# Patient Record
Sex: Male | Born: 1970 | Race: Black or African American | Hispanic: No | Marital: Married | Smoking: Never smoker
Health system: Southern US, Community
[De-identification: ages and names within clinical notes are randomized; demographics above are authoritative.]

---

## 2020-01-18 ENCOUNTER — Inpatient Hospital Stay (HOSPITAL_COMMUNITY)
Admission: EM | Admit: 2020-01-18 | Discharge: 2020-01-22 | DRG: 177 | Disposition: A | Discharge status: Home / Self Care | Payer: HRSA Program | Source: Ambulatory Visit | Attending: Family Medicine | Admitting: Family Medicine

## 2020-01-18 ENCOUNTER — Emergency Department (HOSPITAL_COMMUNITY): Payer: HRSA Program

## 2020-01-18 ENCOUNTER — Encounter (HOSPITAL_COMMUNITY): Payer: Self-pay | Admitting: Family Medicine

## 2020-01-18 DIAGNOSIS — E1165 Type 2 diabetes mellitus with hyperglycemia: Secondary | ICD-10-CM | POA: Diagnosis present

## 2020-01-18 DIAGNOSIS — R410 Disorientation, unspecified: Secondary | ICD-10-CM

## 2020-01-18 DIAGNOSIS — N179 Acute kidney failure, unspecified: Secondary | ICD-10-CM | POA: Diagnosis present

## 2020-01-18 DIAGNOSIS — U071 COVID-19: Secondary | ICD-10-CM | POA: Diagnosis present

## 2020-01-18 DIAGNOSIS — G4730 Sleep apnea, unspecified: Secondary | ICD-10-CM | POA: Diagnosis present

## 2020-01-18 DIAGNOSIS — J9601 Acute respiratory failure with hypoxia: Secondary | ICD-10-CM | POA: Diagnosis present

## 2020-01-18 DIAGNOSIS — J1282 Pneumonia due to coronavirus disease 2019: Secondary | ICD-10-CM

## 2020-01-18 LAB — COMPREHENSIVE METABOLIC PANEL
ALT: 25 U/L (ref 0–44)
AST: 44 U/L — ABNORMAL HIGH (ref 15–41)
Albumin: 3.2 g/dL — ABNORMAL LOW (ref 3.5–5.0)
Alkaline Phosphatase: 45 U/L (ref 38–126)
Anion gap: 13 (ref 5–15)
BUN: 31 mg/dL — ABNORMAL HIGH (ref 6–20)
CO2: 33 mmol/L — ABNORMAL HIGH (ref 22–32)
Calcium: 8.2 mg/dL — ABNORMAL LOW (ref 8.9–10.3)
Chloride: 91 mmol/L — ABNORMAL LOW (ref 98–111)
Creatinine, Ser: 1.58 mg/dL — ABNORMAL HIGH (ref 0.61–1.24)
GFR calc Af Amer: 59 mL/min — ABNORMAL LOW (ref >60)
GFR calc non Af Amer: 51 mL/min — ABNORMAL LOW (ref >60)
Glucose, Bld: 135 mg/dL — ABNORMAL HIGH (ref 70–99)
Potassium: 3.9 mmol/L (ref 3.5–5.1)
Sodium: 137 mmol/L (ref 135–145)
Total Bilirubin: 0.6 mg/dL (ref 0.3–1.2)
Total Protein: 7.5 g/dL (ref 6.5–8.1)

## 2020-01-18 LAB — CBC WITH DIFFERENTIAL/PLATELET
Abs Immature Granulocytes: 0.06 10*3/uL (ref 0.00–0.07)
Basophils Absolute: 0 10*3/uL (ref 0.0–0.1)
Basophils Relative: 0 %
Eosinophils Absolute: 0 10*3/uL (ref 0.0–0.5)
Eosinophils Relative: 0 %
HCT: 42 % (ref 39.0–52.0)
Hemoglobin: 13.4 g/dL (ref 13.0–17.0)
Immature Granulocytes: 1 %
Lymphocytes Relative: 18 %
Lymphs Abs: 1 10*3/uL (ref 0.7–4.0)
MCH: 27.1 pg (ref 26.0–34.0)
MCHC: 31.9 g/dL (ref 30.0–36.0)
MCV: 84.8 fL (ref 80.0–100.0)
Monocytes Absolute: 0.4 10*3/uL (ref 0.1–1.0)
Monocytes Relative: 7 %
Neutro Abs: 4.2 10*3/uL (ref 1.7–7.7)
Neutrophils Relative %: 74 %
Platelets: 229 10*3/uL (ref 150–400)
RBC: 4.95 MIL/uL (ref 4.22–5.81)
RDW: 14.5 % (ref 11.5–15.5)
WBC: 5.6 10*3/uL (ref 4.0–10.5)
nRBC: 0.4 % — ABNORMAL HIGH (ref 0.0–0.2)

## 2020-01-18 LAB — MRSA PCR SCREENING: MRSA by PCR: NEGATIVE

## 2020-01-18 LAB — D-DIMER, QUANTITATIVE: D-Dimer, Quant: 1.4 ug/mL-FEU — ABNORMAL HIGH (ref 0.00–0.50)

## 2020-01-18 LAB — PROCALCITONIN: Procalcitonin: 0.17 ng/mL

## 2020-01-18 LAB — FERRITIN: Ferritin: 242 ng/mL (ref 24–336)

## 2020-01-18 LAB — LACTATE DEHYDROGENASE: LDH: 488 U/L — ABNORMAL HIGH (ref 98–192)

## 2020-01-18 LAB — BRAIN NATRIURETIC PEPTIDE: B Natriuretic Peptide: 93.4 pg/mL (ref 0.0–100.0)

## 2020-01-18 LAB — ABO/RH: ABO/RH(D): B POS

## 2020-01-18 LAB — TRIGLYCERIDES: Triglycerides: 120 mg/dL (ref <150)

## 2020-01-18 LAB — POC SARS CORONAVIRUS 2 AG -  ED: SARS Coronavirus 2 Ag: POSITIVE — AB

## 2020-01-18 LAB — LACTIC ACID, PLASMA
Lactic Acid, Venous: 1.4 mmol/L (ref 0.5–1.9)
Lactic Acid, Venous: 1.3 mmol/L (ref 0.5–1.9)

## 2020-01-18 LAB — C-REACTIVE PROTEIN: CRP: 20.8 mg/dL — ABNORMAL HIGH (ref <1.0)

## 2020-01-18 LAB — FIBRINOGEN: Fibrinogen: 671 mg/dL — ABNORMAL HIGH (ref 210–475)

## 2020-01-18 MED ORDER — CHLORHEXIDINE GLUCONATE CLOTH 2 % EX PADS
6.0000 | MEDICATED_PAD | Freq: Every day | CUTANEOUS | Status: DC
  Administered 2020-01-18 – 2020-01-20 (×2): 6 via TOPICAL

## 2020-01-18 MED ORDER — DEXAMETHASONE SODIUM PHOSPHATE 10 MG/ML IJ SOLN
6.0000 mg | Freq: Once | INTRAMUSCULAR | Status: AC
  Administered 2020-01-18: 16:00:00 6 mg via INTRAVENOUS
  Filled 2020-01-18: qty 1

## 2020-01-18 MED ORDER — ONDANSETRON HCL 4 MG PO TABS: 4.0000 mg | ORAL_TABLET | Freq: Four times a day (QID) | ORAL | Status: DC | PRN

## 2020-01-18 MED ORDER — ENOXAPARIN SODIUM 80 MG/0.8ML ~~LOC~~ SOLN
80.0000 mg | SUBCUTANEOUS | Status: DC
  Administered 2020-01-18 – 2020-01-21 (×4): 80 mg via SUBCUTANEOUS
  Filled 2020-01-18 (×4): qty 0.8

## 2020-01-18 MED ORDER — DEXAMETHASONE SODIUM PHOSPHATE 10 MG/ML IJ SOLN
6.0000 mg | Freq: Every day | INTRAMUSCULAR | Status: DC
  Administered 2020-01-19 – 2020-01-22 (×4): 6 mg via INTRAVENOUS
  Filled 2020-01-18 (×4): qty 1

## 2020-01-18 MED ORDER — TOCILIZUMAB 400 MG/20ML IV SOLN
800.0000 mg | Freq: Once | INTRAVENOUS | Status: AC
  Administered 2020-01-18: 21:00:00 800 mg via INTRAVENOUS
  Filled 2020-01-18: qty 40

## 2020-01-18 MED ORDER — SODIUM CHLORIDE 0.9 % IV SOLN: 100.0000 mg | Freq: Every day | INTRAVENOUS | Status: DC

## 2020-01-18 MED ORDER — ENOXAPARIN SODIUM 80 MG/0.8ML ~~LOC~~ SOLN
80.0000 mg | Freq: Two times a day (BID) | SUBCUTANEOUS | Status: DC
  Filled 2020-01-18: qty 0.8

## 2020-01-18 MED ORDER — SODIUM CHLORIDE 0.9 % IV SOLN
100.0000 mg | INTRAVENOUS | Status: AC
  Administered 2020-01-18 (×2): 100 mg via INTRAVENOUS
  Filled 2020-01-18 (×2): qty 20

## 2020-01-18 MED ORDER — SODIUM CHLORIDE 0.9 % IV SOLN
100.0000 mg | Freq: Every day | INTRAVENOUS | Status: DC
  Administered 2020-01-19 – 2020-01-22 (×4): 100 mg via INTRAVENOUS
  Filled 2020-01-18 (×4): qty 20

## 2020-01-18 MED ORDER — ACETAMINOPHEN 325 MG PO TABS
650.0000 mg | ORAL_TABLET | Freq: Four times a day (QID) | ORAL | Status: DC | PRN
  Administered 2020-01-20: 650 mg via ORAL
  Filled 2020-01-18: qty 2

## 2020-01-18 MED ORDER — ONDANSETRON HCL 4 MG/2ML IJ SOLN: 4.0000 mg | Freq: Four times a day (QID) | INTRAMUSCULAR | Status: DC | PRN

## 2020-01-18 MED ORDER — SODIUM CHLORIDE 0.9 % IV SOLN: 200.0000 mg | Freq: Once | INTRAVENOUS | Status: DC

## 2020-01-18 MED ORDER — ORAL CARE MOUTH RINSE
15.0000 mL | Freq: Two times a day (BID) | OROMUCOSAL | Status: DC
  Administered 2020-01-18 – 2020-01-22 (×8): 15 mL via OROMUCOSAL

## 2020-01-18 NOTE — Progress Notes (Signed)
Pt arrived to floor from ED. Called central TELE to place on monitor. CHG bath given, MRSA swab sent. Alert x4, placed pt on 15L HFNC, diminished in all fields, dyspnea on exertion. Patient walked from stretcher to bed, fatigue/general weakness noted. 2 IVs NSL, WNL.

## 2020-01-18 NOTE — ED Provider Notes (Signed)
[facility]-EMERGENCY DEPT Provider Note   CSN: [id number] Arrival date & time: 01/18/20  1358     History Chief Complaint  Patient presents with  . Shortness of Breath    Andrew [name] is a 49 y.o. male.  He said he tested positive for Covid on March 13 while at [facility].  He has been short of breath for a week.  He said his wife said his breathing pattern was strange when he sleeps.  He went to his primary care doctor who found his sats to be 67% on room air.  EMS placed on nonrebreather.  Patient states he would be unable to climb a flight of stairs.  Cough productive of some yellow sputum.  Headache, lack of taste and smell.  Low-grade fevers.  No abdominal pain vomiting or diarrhea.  The history is provided by the patient.  Influenza Presenting symptoms: cough, fatigue, fever, headache, myalgias and shortness of breath   Presenting symptoms: no sore throat   Severity:  Severe Onset quality:  Gradual Progression:  Unchanged Chronicity:  New Relieved by:  Nothing Worsened by:  Movement Ineffective treatments:  None tried Associated symptoms: no chills, no neck stiffness and no syncope        No past medical history on file.  There are no problems to display for this patient.   History reviewed. No pertinent surgical history.     No family history on file.  Social History   Tobacco Use  . Smoking status: Not on file  Substance Use Topics  . Alcohol use: Not on file  . Drug use: Not on file    Home Medications Prior to Admission medications   Not on File    Allergies    Patient has no known allergies.  Review of Systems   Review of Systems  Constitutional: Positive for fatigue and fever. Negative for chills.  HENT: Negative for sore throat.   Eyes: Negative for visual disturbance.  Respiratory: Positive for cough and shortness of breath.   Cardiovascular: Negative for chest pain.  Gastrointestinal: Negative for abdominal  pain.  Genitourinary: Negative for dysuria.  Musculoskeletal: Positive for myalgias. Negative for neck stiffness.  Skin: Negative for rash.  Neurological: Positive for headaches.    Physical Exam Updated Vital Signs BP (!) 141/95 (BP Location: Right Arm)   Pulse 97   Temp 99.5 F (37.5 C) (Oral)   Resp (!) 24   Ht 5\' 10"  (1.778 m)   Wt (!) 158.8 kg   SpO2 (!) 70% Comment: pt placed on non rebreather by ems. pt sats are now 97%  BMI 50.22 kg/m   Physical Exam Vitals and nursing note reviewed.  Constitutional:      Appearance: He is well-developed. He is obese.  HENT:     Head: Normocephalic and atraumatic.  Eyes:     Conjunctiva/sclera: Conjunctivae normal.  Cardiovascular:     Rate and Rhythm: Normal rate and regular rhythm.     Heart sounds: No murmur.  Pulmonary:     Effort: Tachypnea and accessory muscle usage present. No respiratory distress.     Breath sounds: Normal breath sounds.  Abdominal:     Palpations: Abdomen is soft.     Tenderness: There is no abdominal tenderness.  Musculoskeletal:        General: Normal range of motion.     Cervical back: Neck supple.     Right lower leg: No tenderness.     Left lower leg:  No tenderness.  Skin:    General: Skin is warm and dry.     Capillary Refill: Capillary refill takes less than 2 seconds.  Neurological:     General: No focal deficit present.     Mental Status: He is alert.     ED Results / Procedures / Treatments   Labs (all labs ordered are listed, but only abnormal results are displayed) Labs Reviewed  CBC WITH DIFFERENTIAL/PLATELET - Abnormal; Notable for the following components:      Result Value   nRBC 0.4 (*)    All other components within normal limits  COMPREHENSIVE METABOLIC PANEL - Abnormal; Notable for the following components:   Chloride 91 (*)    CO2 33 (*)    Glucose, Bld 135 (*)    BUN 31 (*)    Creatinine, Ser 1.58 (*)    Calcium 8.2 (*)    Albumin 3.2 (*)    AST 44 (*)    GFR  calc non Af Amer 51 (*)    GFR calc Af Amer 59 (*)    All other components within normal limits  D-DIMER, QUANTITATIVE (NOT AT [facility]) - Abnormal; Notable for the following components:   D-Dimer, Quant 1.40 (*)    All other components within normal limits  LACTATE DEHYDROGENASE - Abnormal; Notable for the following components:   LDH 488 (*)    All other components within normal limits  FIBRINOGEN - Abnormal; Notable for the following components:   Fibrinogen 671 (*)    All other components within normal limits  C-REACTIVE PROTEIN - Abnormal; Notable for the following components:   CRP 20.8 (*)    All other components within normal limits  POC SARS CORONAVIRUS 2 AG -  ED - Abnormal; Notable for the following components:   SARS Coronavirus 2 Ag POSITIVE (*)    All other components within normal limits  CULTURE, BLOOD (ROUTINE X 2)  CULTURE, BLOOD (ROUTINE X 2)  MRSA PCR SCREENING  LACTIC ACID, PLASMA  LACTIC ACID, PLASMA  PROCALCITONIN  FERRITIN  TRIGLYCERIDES  BRAIN NATRIURETIC PEPTIDE  HIV ANTIBODY (ROUTINE TESTING W REFLEX)  CBC WITH DIFFERENTIAL/PLATELET  COMPREHENSIVE METABOLIC PANEL  C-REACTIVE PROTEIN  D-DIMER, QUANTITATIVE (NOT AT [facility])  FERRITIN  MAGNESIUM  PHOSPHORUS  ABO/RH    EKG EKG Interpretation  Date/Time:  Monday January 18 2020 14:04:13 EDT Ventricular Rate:  99 PR Interval:    QRS Duration: 97 QT Interval:  335 QTC Calculation: 430 R Axis:   13 Text Interpretation: Ectopic atrial rhythm Anterior infarct, old No old tracing to compare Confirmed by Meridee Score [phone]) on 01/18/2020 2:20:21 PM   Radiology DG Chest Port 1 View  Result Date: 01/18/2020 CLINICAL DATA:  49 yo male diagnosed Covid + on March 13th. C/O increased SHOB for the past 3 days. Went to PCP for eval today. Sat at that time 67%RA. Placed on 3NC. Sat in the upper 80'S when EMS arrivedd. NRB placed by EMS and Sat improved to.*comment was truncated*sob covid EXAM: PORTABLE CHEST 1  VIEW COMPARISON:  None. FINDINGS: Enlarged cardiac silhouette. Low lung volumes. There is patchy bilateral airspace disease. No pneumothorax. No effusions evident IMPRESSION: Patchy bilateral airspace disease consistent with COVID pneumonia Electronically Signed   By: Genevive Bi M.D.   On: 01/18/2020 15:13    Procedures .Critical Care Performed by: Terrilee Files, MD Authorized by: Terrilee Files, MD   Critical care provider statement:    Critical care time (minutes):  45  Critical care time was exclusive of:  Separately billable procedures and treating other patients   Critical care was necessary to treat or prevent imminent or life-threatening deterioration of the following conditions:  Respiratory failure   Critical care was time spent personally by me on the following activities:  Discussions with consultants, evaluation of patient's response to treatment, examination of patient, ordering and performing treatments and interventions, ordering and review of laboratory studies, ordering and review of radiographic studies, pulse oximetry, re-evaluation of patient's condition, obtaining history from patient or surrogate, review of old charts and development of treatment plan with patient or surrogate   I assumed direction of critical care for this patient from another provider in my specialty: no     (including critical care time)  Medications Ordered in ED Medications  tocilizumab (ACTEMRA) 800 mg in sodium chloride 0.9 % 100 mL infusion (has no administration in time range)  acetaminophen (TYLENOL) tablet 650 mg (has no administration in time range)  ondansetron (ZOFRAN) tablet 4 mg (has no administration in time range)    Or  ondansetron (ZOFRAN) injection 4 mg (has no administration in time range)  dexamethasone (DECADRON) injection 6 mg (has no administration in time range)  remdesivir 100 mg in sodium chloride 0.9 % 100 mL IVPB (has no administration in time range)     Followed by  remdesivir 100 mg in sodium chloride 0.9 % 100 mL IVPB (has no administration in time range)  Chlorhexidine Gluconate Cloth 2 % PADS 6 each (has no administration in time range)  MEDLINE mouth rinse (has no administration in time range)  enoxaparin (LOVENOX) injection 80 mg (has no administration in time range)  dexamethasone (DECADRON) injection 6 mg (6 mg Intravenous Given 01/18/20 1548)    ED Course  I have reviewed the triage vital signs and the nursing notes.  Pertinent labs & imaging results that were available during my care of the patient were reviewed by me and considered in my medical decision making (see chart for details).  Clinical Course as of Jan 17 1913  Mon Jan 18, 2020  1420 Differential includes Covid pneumonia, bacterial superinfection, respiratory failure, metabolic derangement   [MB]  1517 Chest x-ray interpreted by me as underpenetrated due to habitus likely multifocal pneumonia.   [MB]  [id number] Patient still on 15 L nonrebreather.  Discussed with respiratory who was can of bring down to [place] and see if that would get him more comfortable.    [MB]  1643 Discussed with Dr. Caleb Popp from [facility] to evaluate the patient for admission.   [MB]    Clinical Course User Index [MB] Terrilee Files, MD   MDM Rules/Calculators/A&P                     Andrew [name] was evaluated in Emergency Department on 01/18/2020 for the symptoms described in the history of present illness. He was evaluated in the context of the global COVID-19 pandemic, which necessitated consideration that the patient might be at risk for infection with the SARS-CoV-2 virus that causes COVID-19. Institutional protocols and algorithms that pertain to the evaluation of patients at risk for COVID-19 are in a state of rapid change based on information released by regulatory bodies including the CDC and federal and state organizations. These policies and algorithms were followed during the  patient's care in the ED.   Final Clinical Impression(s) / ED Diagnoses Final diagnoses:  Pneumonia due to COVID-19 virus  Acute respiratory failure with  hypoxia [facility])    Rx / DC Orders ED Discharge Orders    None       Hayden Rasmussen, MD 01/18/20 [phone]

## 2020-01-18 NOTE — ED Notes (Signed)
Attempted to call update to wife, Burnis Kingfisher. Answering machine, no message left for HIPPA reasons.

## 2020-01-18 NOTE — ED Triage Notes (Signed)
49 yo male diagnosed Covid + on March 13th. C/O increased SHOB for the past 3 days. Went to PCP for eval today. Sat at that time 67%RA. Placed on 3NC. Sat in the upper 80'S when EMS arrivedd. NRB placed by EMS and Sat improved to 98%. Pt arrives A/Ox3. Denies fevers chills. Skin w/d/warm. Denies chest pain.  Denies PMH

## 2020-01-18 NOTE — H&P (Signed)
History and Physical    Andrew [name] PYK:[id number] DOB: 1971-06-02 DOA: 01/18/2020  PCP: Andrew Sailors, PA Patient coming from: Home  Chief Complaint: Dyspnea and confusion  HPI: Andrew [name] is a 49 y.o. male with no medical history. Patient states that he was at [place] with his wife and EMS was called for transport to the hospital. Per wife, she took him to urgent care today because of change in breathing and confusion. She also notes that he had very dry mucous membranes. At urgent care, EMS was called for transport to the ED.  ED Course: Vitals: Tmax of 99.5 F, pulse of 89 bpm, respirations of 27, BP of 118/71, SpO2 of 95% on HFNC Labs: CO2 of 33, BUN of 31, Creatinine of 1.58, calcium of 8.2, d-dimer of 1.4, fibrinogen of 671, procalcitonin of 0.17, CRP of 20.8 Imaging: Chest x-ray significant for bilateral patchy airspace disease Medications/Course: Dexamethasone IV  Review of Systems: Review of Systems  Constitutional: Positive for chills and fever.  Respiratory: Negative for shortness of breath.   Cardiovascular: Negative for chest pain.  Gastrointestinal: Negative for constipation, diarrhea, nausea and vomiting.  All other systems reviewed and are negative.   History reviewed. No pertinent past medical history.  History reviewed. No pertinent surgical history.   has no history on file for tobacco, alcohol, and drug.  No Known Allergies  Family History  Problem Relation Age of Onset  . Heart disease Father     Prior to Admission medications   Not on File    Physical Exam:  Physical Exam Constitutional:      General: He is not in acute distress.    Appearance: Normal appearance. He is well-developed. He is morbidly obese. He is not ill-appearing, toxic-appearing or diaphoretic.  HENT:     Mouth/Throat:     Mouth: Mucous membranes are dry.  Eyes:     Conjunctiva/sclera: Conjunctivae normal.     Pupils: Pupils are equal, round, and  reactive to light.  Cardiovascular:     Rate and Rhythm: Normal rate and regular rhythm.     Heart sounds: Normal heart sounds. No murmur.  Pulmonary:     Effort: Pulmonary effort is normal. No respiratory distress.     Breath sounds: Decreased air movement (secondary to body habitus) present. No wheezing or rales.  Abdominal:     General: Abdomen is protuberant. Bowel sounds are normal. There is no distension.     Palpations: Abdomen is soft.     Tenderness: There is no abdominal tenderness. There is no guarding or rebound.  Musculoskeletal:        General: No tenderness. Normal range of motion.     Cervical back: Normal range of motion.  Lymphadenopathy:     Cervical: No cervical adenopathy.  Skin:    General: Skin is warm and dry.  Neurological:     Mental Status: He is alert and oriented to person, place, and time.  Psychiatric:        Behavior: Behavior is cooperative.     Labs on Admission: I have personally reviewed following labs and imaging studies  CBC: Recent Labs  Lab 01/18/20 1455  WBC 5.6  NEUTROABS 4.2  HGB 13.4  HCT 42.0  MCV 84.8  PLT 539    Basic Metabolic Panel: Recent Labs  Lab 01/18/20 1455  NA 137  K 3.9  CL 91*  CO2 33*  GLUCOSE 135*  BUN 31*  CREATININE 1.58*  CALCIUM 8.2*  GFR: Estimated Creatinine Clearance: 87.7 mL/min (A) (by C-G formula based on SCr of 1.58 mg/dL (H)).  Liver Function Tests: Recent Labs  Lab 01/18/20 1455  AST 44*  ALT 25  ALKPHOS 45  BILITOT 0.6  PROT 7.5  ALBUMIN 3.2*   No results for input(s): LIPASE, AMYLASE in the last 168 hours. No results for input(s): AMMONIA in the last 168 hours.  Coagulation Profile: No results for input(s): INR, PROTIME in the last 168 hours.  Cardiac Enzymes: No results for input(s): CKTOTAL, CKMB, CKMBINDEX, TROPONINI in the last 168 hours.  BNP (last 3 results) No results for input(s): PROBNP in the last 8760 hours.  HbA1C: No results for input(s): HGBA1C in  the last 72 hours.  CBG: No results for input(s): GLUCAP in the last 168 hours.  Lipid Profile: Recent Labs    01/18/20 1455  TRIG 120    Thyroid Function Tests: No results for input(s): TSH, T4TOTAL, FREET4, T3FREE, THYROIDAB in the last 72 hours.  Anemia Panel: Recent Labs    01/18/20 1455  FERRITIN 242    Urine analysis: No results found for: COLORURINE, APPEARANCEUR, LABSPEC, PHURINE, GLUCOSEU, HGBUR, BILIRUBINUR, KETONESUR, PROTEINUR, UROBILINOGEN, NITRITE, LEUKOCYTESUR   Radiological Exams on Admission: DG Chest Port 1 View  Result Date: 01/18/2020 CLINICAL DATA:  49 yo male diagnosed Covid + on March 13th. C/O increased SHOB for the past 3 days. Went to PCP for eval today. Sat at that time 67%RA. Placed on 3NC. Sat in the upper 80'S when EMS arrivedd. NRB placed by EMS and Sat improved to.*comment was truncated*sob covid EXAM: PORTABLE CHEST 1 VIEW COMPARISON:  None. FINDINGS: Enlarged cardiac silhouette. Low lung volumes. There is patchy bilateral airspace disease. No pneumothorax. No effusions evident IMPRESSION: Patchy bilateral airspace disease consistent with COVID pneumonia Electronically Signed   By: Genevive Bi M.D.   On: 01/18/2020 15:13    EKG: Independently reviewed. Sinus rhythm  Assessment/Plan Active Problems:   Pneumonia due to COVID-19 virus   COVID-19 Pneumonia No known exposure. Patient states he has been using PPE and staying indoors. Positive test from 3/13 per patient. Currently requiring 10 L via HFNC with labs concerning for more significant disease. Patient appears stable at this time. Discussed use of Remdesivir and Actemra. Risks and benefits discussed. -Continue Decadron -Remdesivir per pharmacy -Actemra 800 mg IV x1 -Daily CMP, CBC, Ferritin, D-dimer, CRP, mag, phos  Acute respiratory failure with hypoxia Secondary to above -Continue HFNC and keep O2 >92%.   AKI No baseline known. Clinically, patient appears dry. -Normal  saline IV fluids overnight; judicious use of IV fluids in setting of above -BMP in AM  Confusion Possibly secondary to COVID. Monitor  DVT prophylaxis: Lovenox Code Status: Full code Family Communication: Wife on telephone Disposition Plan: Stepdown Consults called: None Admission status: Inpatient   Andrew Hawking, MD [facility] 01/18/2020, 5:19 PM

## 2020-01-18 NOTE — ED Notes (Signed)
Resp at bedside to change NRB to 10L High flow humidified O2. Pt A/Ox3. Skin w/d/pink. Hospitalist at bedside to eval.

## 2020-01-19 ENCOUNTER — Inpatient Hospital Stay (HOSPITAL_COMMUNITY): Payer: HRSA Program

## 2020-01-19 ENCOUNTER — Other Ambulatory Visit: Payer: Self-pay

## 2020-01-19 DIAGNOSIS — J9601 Acute respiratory failure with hypoxia: Secondary | ICD-10-CM

## 2020-01-19 DIAGNOSIS — N179 Acute kidney failure, unspecified: Secondary | ICD-10-CM

## 2020-01-19 DIAGNOSIS — R410 Disorientation, unspecified: Secondary | ICD-10-CM

## 2020-01-19 LAB — COMPREHENSIVE METABOLIC PANEL
ALT: 25 U/L (ref 0–44)
AST: 38 U/L (ref 15–41)
Albumin: 3.2 g/dL — ABNORMAL LOW (ref 3.5–5.0)
Alkaline Phosphatase: 48 U/L (ref 38–126)
Anion gap: 15 (ref 5–15)
BUN: 37 mg/dL — ABNORMAL HIGH (ref 6–20)
CO2: 33 mmol/L — ABNORMAL HIGH (ref 22–32)
Calcium: 8.3 mg/dL — ABNORMAL LOW (ref 8.9–10.3)
Chloride: 94 mmol/L — ABNORMAL LOW (ref 98–111)
Creatinine, Ser: 1.5 mg/dL — ABNORMAL HIGH (ref 0.61–1.24)
GFR calc Af Amer: >60 mL/min (ref >60)
GFR calc non Af Amer: 55 mL/min — ABNORMAL LOW (ref >60)
Glucose, Bld: 170 mg/dL — ABNORMAL HIGH (ref 70–99)
Potassium: 4.4 mmol/L (ref 3.5–5.1)
Sodium: 142 mmol/L (ref 135–145)
Total Bilirubin: 0.4 mg/dL (ref 0.3–1.2)
Total Protein: 7.8 g/dL (ref 6.5–8.1)

## 2020-01-19 LAB — BLOOD GAS, ARTERIAL
Acid-Base Excess: 7.8 mmol/L — ABNORMAL HIGH (ref 0.0–2.0)
Bicarbonate: 33.6 mmol/L — ABNORMAL HIGH (ref 20.0–28.0)
FIO2: 40
O2 Saturation: 83.9 %
Patient temperature: 98.6
pCO2 arterial: 52.8 mmHg — ABNORMAL HIGH (ref 32.0–48.0)
pH, Arterial: 7.42 (ref 7.350–7.450)
pO2, Arterial: 50.8 mmHg — ABNORMAL LOW (ref 83.0–108.0)

## 2020-01-19 LAB — CBC WITH DIFFERENTIAL/PLATELET
Abs Immature Granulocytes: 0.05 10*3/uL (ref 0.00–0.07)
Basophils Absolute: 0 10*3/uL (ref 0.0–0.1)
Basophils Relative: 0 %
Eosinophils Absolute: 0 10*3/uL (ref 0.0–0.5)
Eosinophils Relative: 0 %
HCT: 42.8 % (ref 39.0–52.0)
Hemoglobin: 13.3 g/dL (ref 13.0–17.0)
Immature Granulocytes: 1 %
Lymphocytes Relative: 14 %
Lymphs Abs: 0.7 10*3/uL (ref 0.7–4.0)
MCH: 26.8 pg (ref 26.0–34.0)
MCHC: 31.1 g/dL (ref 30.0–36.0)
MCV: 86.1 fL (ref 80.0–100.0)
Monocytes Absolute: 0.3 10*3/uL (ref 0.1–1.0)
Monocytes Relative: 5 %
Neutro Abs: 3.7 10*3/uL (ref 1.7–7.7)
Neutrophils Relative %: 80 %
Platelets: 240 10*3/uL (ref 150–400)
RBC: 4.97 MIL/uL (ref 4.22–5.81)
RDW: 14.6 % (ref 11.5–15.5)
WBC: 4.7 10*3/uL (ref 4.0–10.5)
nRBC: 0.9 % — ABNORMAL HIGH (ref 0.0–0.2)

## 2020-01-19 LAB — GLUCOSE, CAPILLARY
Glucose-Capillary: 119 mg/dL — ABNORMAL HIGH (ref 70–99)
Glucose-Capillary: 132 mg/dL — ABNORMAL HIGH (ref 70–99)
Glucose-Capillary: 134 mg/dL — ABNORMAL HIGH (ref 70–99)
Glucose-Capillary: 205 mg/dL — ABNORMAL HIGH (ref 70–99)

## 2020-01-19 LAB — MAGNESIUM: Magnesium: 3.3 mg/dL — ABNORMAL HIGH (ref 1.7–2.4)

## 2020-01-19 LAB — PHOSPHORUS: Phosphorus: 5.6 mg/dL — ABNORMAL HIGH (ref 2.5–4.6)

## 2020-01-19 LAB — C-REACTIVE PROTEIN: CRP: 21.3 mg/dL — ABNORMAL HIGH (ref <1.0)

## 2020-01-19 LAB — HEMOGLOBIN A1C
Hgb A1c MFr Bld: 7 % — ABNORMAL HIGH (ref 4.8–5.6)
Mean Plasma Glucose: 154.2 mg/dL

## 2020-01-19 LAB — FERRITIN: Ferritin: 211 ng/mL (ref 24–336)

## 2020-01-19 LAB — HIV ANTIBODY (ROUTINE TESTING W REFLEX): HIV Screen 4th Generation wRfx: NONREACTIVE

## 2020-01-19 LAB — D-DIMER, QUANTITATIVE: D-Dimer, Quant: 1.35 ug/mL-FEU — ABNORMAL HIGH (ref 0.00–0.50)

## 2020-01-19 MED ORDER — INSULIN ASPART 100 UNIT/ML ~~LOC~~ SOLN
0.0000 [IU] | Freq: Three times a day (TID) | SUBCUTANEOUS | Status: DC
  Administered 2020-01-19: 2 [IU] via SUBCUTANEOUS
  Administered 2020-01-19: 17:00:00 5 [IU] via SUBCUTANEOUS
  Administered 2020-01-20: 3 [IU] via SUBCUTANEOUS
  Administered 2020-01-20 – 2020-01-21 (×3): 2 [IU] via SUBCUTANEOUS
  Administered 2020-01-21: 3 [IU] via SUBCUTANEOUS
  Administered 2020-01-22: 2 [IU] via SUBCUTANEOUS

## 2020-01-19 MED ORDER — ALBUTEROL SULFATE HFA 108 (90 BASE) MCG/ACT IN AERS
1.0000 | INHALATION_SPRAY | Freq: Four times a day (QID) | RESPIRATORY_TRACT | Status: DC
  Administered 2020-01-19: 1 via RESPIRATORY_TRACT
  Filled 2020-01-19: qty 6.7

## 2020-01-19 MED ORDER — ALBUTEROL SULFATE HFA 108 (90 BASE) MCG/ACT IN AERS
4.0000 | INHALATION_SPRAY | Freq: Four times a day (QID) | RESPIRATORY_TRACT | Status: DC
  Administered 2020-01-19 – 2020-01-22 (×13): 4 via RESPIRATORY_TRACT

## 2020-01-19 MED ORDER — SODIUM CHLORIDE 0.9 % IV SOLN: INTRAVENOUS | Status: AC

## 2020-01-19 MED ORDER — ORAL CARE MOUTH RINSE: 15.0000 mL | Freq: Two times a day (BID) | OROMUCOSAL | Status: DC

## 2020-01-19 MED ORDER — PANTOPRAZOLE SODIUM 40 MG PO TBEC
40.0000 mg | DELAYED_RELEASE_TABLET | Freq: Every day | ORAL | Status: DC
  Administered 2020-01-19 – 2020-01-22 (×4): 40 mg via ORAL
  Filled 2020-01-19 (×4): qty 1

## 2020-01-19 NOTE — Progress Notes (Signed)
PROGRESS NOTE    Andrew [name]    Code Status: Full Code  CHE:[id number] DOB: 03-01-1971 DOA: 01/18/2020 LOS: 1 days  PCP: Norm Salt, PA CC:  Chief Complaint  Patient presents with  . Shortness of Breath  . COVID Positive       Hospital Summary   This is a 49 year old male without known past medical history who presented from urgent care due to change in breathing and confusion with dry mucous membranes and brought in via EMS from urgent care.  Found to be positive for COVID-19.  ED Course: Vitals: Tmax of 99.5 F, pulse of 89 bpm, respirations of 27, BP of 118/71, SpO2 of 95% on HFNC Labs: CO2 of 33, BUN of 31, Creatinine of 1.58, calcium of 8.2, d-dimer of 1.4, fibrinogen of 671, procalcitonin of 0.17, CRP of 20.8 Imaging: Chest x-ray significant for bilateral patchy airspace disease Medications/Course: Dexamethasone IV  A & P   Principal Problem:   Pneumonia due to COVID-19 virus Active Problems:   Acute respiratory failure with hypoxia (HCC)   AKI (acute kidney injury) (HCC)   Confusion   1. Acute hypoxic respiratory failure secondary to COVID-19 suspected OSA/OHS a. Improved respiratory status, requiring 2 to 4 L/min nasal cannula now b. Transfer out of stepdown unit c. Status post Actemra d. Continue remdesivir x5 days, dexamethasone x10 days e. Trend inflammatory markers f. Incentive spirometry g. Flutter valve h. Standing albuterol i. Protonix in case GERD component  2. Suspected Covid encephalopathy  a. Seems improved but not at baseline b. CT brain c. IV fluids d. Monitor BUN while on dexamethasone for uremia component e. Continue supportive treatment f. ABG to check for CO2 retention g. PT eval  3. Elevated creatinine, suspected AKI unknown baseline a. IV fluid hydration and follow-up in a.m. b. UA  4. Electrolyte disturbance: Hypermagnesemia, hyperphosphatemia a. Follow-up in a.m. and consider nephrology consult if these continue to  trend upward in setting of renal dysfunction  5. Suspected OSA/OHS a. ABG as above b. No CPAP due to COVID-19 precautions c. Needs outpatient follow-up and testing  DVT prophylaxis: Lovenox Family Communication: Patient's wife has been updated  Disposition Plan:   Patient came from:   Home                                                                                          Anticipated d/c place: Home  Barriers to d/c: Mental status, respiratory status, eval, likely several days away from discharge  Pressure injury documentation    None  Consultants  None  Procedures  None  Antibiotics   Anti-infectives (From admission, onward)   Start     Dose/Rate Route Frequency Ordered Stop   01/19/20 1000  remdesivir 100 mg in sodium chloride 0.9 % 100 mL IVPB  Status:  Discontinued     100 mg 200 mL/hr over 30 Minutes Intravenous Daily 01/18/20 1751 01/18/20 1759   01/19/20 1000  remdesivir 100 mg in sodium chloride 0.9 % 100 mL IVPB     100 mg 200 mL/hr over 30 Minutes Intravenous Daily 01/18/20 1759     01/18/20 1800  remdesivir 100 mg in sodium chloride 0.9 % 100 mL IVPB     100 mg 200 mL/hr over 30 Minutes Intravenous Every 1 hr x 2 01/18/20 1759 01/18/20 2313   01/18/20 1751  remdesivir 200 mg in sodium chloride 0.9% 250 mL IVPB  Status:  Discontinued     200 mg 580 mL/hr over 30 Minutes Intravenous Once 01/18/20 1751 01/18/20 1759        Subjective   Upon presentation to bedside patient was sleeping and snoring.  He was arousable to verbal stimuli but was initially confused and eventually was able to reorient himself.  Upon further questioning he was able to answer questions appropriately however had very slow thought process.  Denied any shortness of breath, chest pain, nausea or vomiting.  No complaints at this time.  Objective   Vitals:   01/19/20 0800 01/19/20 0900 01/19/20 1000 01/19/20 1128  BP: (!) 166/65 (!) 149/84 136/65 114/66  Pulse: 87 80 80 82    Resp: (!) 27 (!) 38 (!) 29 (!) 22  Temp:    98.3 F (36.8 C)  TempSrc:    Oral  SpO2: 95% 94% 95% 90%  Weight:      Height:        Intake/Output Summary (Last 24 hours) at 01/19/2020 1639 Last data filed at 01/19/2020 1400 Gross per 24 hour  Intake 897.72 ml  Output 250 ml  Net 647.72 ml   Filed Weights   01/18/20 1408 01/18/20 2100  Weight: (!) 158.8 kg (!) 169 kg    Examination:  Physical Exam Vitals and nursing note reviewed.  Constitutional:      General: He is not in acute distress.    Appearance: He is obese.  Cardiovascular:     Rate and Rhythm: Normal rate and regular rhythm.  Pulmonary:     Effort: Pulmonary effort is normal. No tachypnea.     Comments: Distant breath sounds due to body habitus Musculoskeletal:     Right lower leg: No edema.     Left lower leg: No edema.  Neurological:     Mental Status: He is oriented to person, place, and time.     Comments: Oriented but very slow thought process and long to answer questions  Psychiatric:        Mood and Affect: Mood normal. Mood is not anxious.     Data Reviewed: I have personally reviewed following labs and imaging studies  CBC: Recent Labs  Lab 01/18/20 1455 01/19/20 0242  WBC 5.6 4.7  NEUTROABS 4.2 3.7  HGB 13.4 13.3  HCT 42.0 42.8  MCV 84.8 86.1  PLT 229 240   Basic Metabolic Panel: Recent Labs  Lab 01/18/20 1455 01/19/20 0242  NA 137 142  K 3.9 4.4  CL 91* 94*  CO2 33* 33*  GLUCOSE 135* 170*  BUN 31* 37*  CREATININE 1.58* 1.50*  CALCIUM 8.2* 8.3*  MG  --  3.3*  PHOS  --  5.6*   GFR: Estimated Creatinine Clearance: 95.9 mL/min (A) (by C-G formula based on SCr of 1.5 mg/dL (H)). Liver Function Tests: Recent Labs  Lab 01/18/20 1455 01/19/20 0242  AST 44* 38  ALT 25 25  ALKPHOS 45 48  BILITOT 0.6 0.4  PROT 7.5 7.8  ALBUMIN 3.2* 3.2*   No results for input(s): LIPASE, AMYLASE in the last 168 hours. No results for input(s): AMMONIA in the last 168 hours. Coagulation  Profile: No results for input(s): INR, PROTIME in the last  168 hours. Cardiac Enzymes: No results for input(s): CKTOTAL, CKMB, CKMBINDEX, TROPONINI in the last 168 hours. BNP (last 3 results) No results for input(s): PROBNP in the last 8760 hours. HbA1C: Recent Labs    01/19/20 1128  HGBA1C 7.0*   CBG: Recent Labs  Lab 01/19/20 1154 01/19/20 1618  GLUCAP 132* 205*   Lipid Profile: Recent Labs    01/18/20 1455  TRIG 120   Thyroid Function Tests: No results for input(s): TSH, T4TOTAL, FREET4, T3FREE, THYROIDAB in the last 72 hours. Anemia Panel: Recent Labs    01/18/20 1455 01/19/20 0242  FERRITIN 242 211   Sepsis Labs: Recent Labs  Lab 01/18/20 1455 01/18/20 1659  PROCALCITON 0.17  --   LATICACIDVEN 1.4 1.3    Recent Results (from the past 240 hour(s))  Blood Culture (routine x 2)     Status: None (Preliminary result)   Collection Time: 01/18/20  2:50 PM   Specimen: BLOOD LEFT FOREARM  Result Value Ref Range Status   Specimen Description BLOOD LEFT FOREARM  Final   Special Requests   Final    BOTTLES DRAWN AEROBIC AND ANAEROBIC Blood Culture adequate volume Performed at [facility], [address] [place], [place] [address]    Culture NO GROWTH < 12 HOURS  Final   Report Status PENDING  Incomplete  Blood Culture (routine x 2)     Status: None (Preliminary result)   Collection Time: 01/18/20  2:55 PM   Specimen: BLOOD LEFT FOREARM  Result Value Ref Range Status   Specimen Description BLOOD LEFT FOREARM  Final   Special Requests   Final    BOTTLES DRAWN AEROBIC AND ANAEROBIC Blood Culture results may not be optimal due to an excessive volume of blood received in culture bottles Performed at [facility], [address], [place], [place] [address]    Culture NO GROWTH < 12 HOURS  Final   Report Status PENDING  Incomplete  MRSA PCR Screening     Status: None   Collection Time: 01/18/20  7:02 PM   Specimen:  Nasopharyngeal  Result Value Ref Range Status   MRSA by PCR NEGATIVE NEGATIVE Final    Comment:        The GeneXpert MRSA Assay (FDA approved for NASAL specimens only), is one component of a comprehensive MRSA colonization surveillance program. It is not intended to diagnose MRSA infection nor to guide or monitor treatment for MRSA infections. Performed at [facility], [address], [place], [place] [address]          Radiology Studies: [facility] Chest Port 1 View  Result Date: 01/18/2020 CLINICAL DATA:  49 yo male diagnosed Covid + on March 13th. C/O increased SHOB for the past 3 days. Went to PCP for eval today. Sat at that time 67%RA. Placed on 3NC. Sat in the upper 80'S when EMS arrivedd. NRB placed by EMS and Sat improved to.*comment was truncated*sob covid EXAM: PORTABLE CHEST 1 VIEW COMPARISON:  None. FINDINGS: Enlarged cardiac silhouette. Low lung volumes. There is patchy bilateral airspace disease. No pneumothorax. No effusions evident IMPRESSION: Patchy bilateral airspace disease consistent with COVID pneumonia Electronically Signed   By: Genevive Bi M.D.   On: 01/18/2020 15:13        Scheduled Meds: . albuterol  4 puff Inhalation Q6H  . Chlorhexidine Gluconate Cloth  6 each Topical Daily  . dexamethasone (DECADRON) injection  6 mg Intravenous Daily  . enoxaparin (LOVENOX) injection  80  mg Subcutaneous Q24H  . insulin aspart  0-15 Units Subcutaneous TID WC  . mouth rinse  15 mL Mouth Rinse BID   Continuous Infusions: . sodium chloride 100 mL/hr at 01/19/20 0752  . remdesivir 100 mg in NS 100 mL Stopped (01/19/20 1100)     Time spent: 26 minutes with over 50% of the time coordinating the patient's care    Harold Hedge, DO [facility] Pager [phone]  Call night coverage person covering after 7pm

## 2020-01-20 ENCOUNTER — Encounter (HOSPITAL_COMMUNITY): Payer: Self-pay | Admitting: Family Medicine

## 2020-01-20 ENCOUNTER — Inpatient Hospital Stay (HOSPITAL_COMMUNITY): Payer: HRSA Program

## 2020-01-20 LAB — COMPREHENSIVE METABOLIC PANEL
ALT: 24 U/L (ref 0–44)
AST: 34 U/L (ref 15–41)
Albumin: 2.8 g/dL — ABNORMAL LOW (ref 3.5–5.0)
Alkaline Phosphatase: 45 U/L (ref 38–126)
Anion gap: 10 (ref 5–15)
BUN: 29 mg/dL — ABNORMAL HIGH (ref 6–20)
CO2: 33 mmol/L — ABNORMAL HIGH (ref 22–32)
Calcium: 8.2 mg/dL — ABNORMAL LOW (ref 8.9–10.3)
Chloride: 95 mmol/L — ABNORMAL LOW (ref 98–111)
Creatinine, Ser: 1.13 mg/dL (ref 0.61–1.24)
GFR calc Af Amer: >60 mL/min (ref >60)
GFR calc non Af Amer: >60 mL/min (ref >60)
Glucose, Bld: 150 mg/dL — ABNORMAL HIGH (ref 70–99)
Potassium: 4 mmol/L (ref 3.5–5.1)
Sodium: 138 mmol/L (ref 135–145)
Total Bilirubin: 0.5 mg/dL (ref 0.3–1.2)
Total Protein: 6.9 g/dL (ref 6.5–8.1)

## 2020-01-20 LAB — CBC WITH DIFFERENTIAL/PLATELET
Abs Immature Granulocytes: 0.1 10*3/uL — ABNORMAL HIGH (ref 0.00–0.07)
Basophils Absolute: 0 10*3/uL (ref 0.0–0.1)
Basophils Relative: 0 %
Eosinophils Absolute: 0 10*3/uL (ref 0.0–0.5)
Eosinophils Relative: 0 %
HCT: 42 % (ref 39.0–52.0)
Hemoglobin: 13 g/dL (ref 13.0–17.0)
Immature Granulocytes: 2 %
Lymphocytes Relative: 15 %
Lymphs Abs: 0.9 10*3/uL (ref 0.7–4.0)
MCH: 27.2 pg (ref 26.0–34.0)
MCHC: 31 g/dL (ref 30.0–36.0)
MCV: 87.9 fL (ref 80.0–100.0)
Monocytes Absolute: 0.4 10*3/uL (ref 0.1–1.0)
Monocytes Relative: 6 %
Neutro Abs: 4.7 10*3/uL (ref 1.7–7.7)
Neutrophils Relative %: 77 %
Platelets: 283 10*3/uL (ref 150–400)
RBC: 4.78 MIL/uL (ref 4.22–5.81)
RDW: 14.5 % (ref 11.5–15.5)
WBC: 6.1 10*3/uL (ref 4.0–10.5)
nRBC: 0 % (ref 0.0–0.2)

## 2020-01-20 LAB — D-DIMER, QUANTITATIVE: D-Dimer, Quant: 0.83 ug/mL-FEU — ABNORMAL HIGH (ref 0.00–0.50)

## 2020-01-20 LAB — GLUCOSE, CAPILLARY
Glucose-Capillary: 143 mg/dL — ABNORMAL HIGH (ref 70–99)
Glucose-Capillary: 128 mg/dL — ABNORMAL HIGH (ref 70–99)
Glucose-Capillary: 139 mg/dL — ABNORMAL HIGH (ref 70–99)
Glucose-Capillary: 161 mg/dL — ABNORMAL HIGH (ref 70–99)
Glucose-Capillary: 217 mg/dL — ABNORMAL HIGH (ref 70–99)

## 2020-01-20 LAB — FERRITIN: Ferritin: 232 ng/mL (ref 24–336)

## 2020-01-20 LAB — PHOSPHORUS: Phosphorus: 4.2 mg/dL (ref 2.5–4.6)

## 2020-01-20 LAB — C-REACTIVE PROTEIN: CRP: 12.3 mg/dL — ABNORMAL HIGH (ref <1.0)

## 2020-01-20 LAB — MAGNESIUM: Magnesium: 2.9 mg/dL — ABNORMAL HIGH (ref 1.7–2.4)

## 2020-01-20 NOTE — Progress Notes (Signed)
PROGRESS NOTE    Andrew [name]  YBO:[id number] DOB: 1971-09-05 DOA: 01/18/2020 PCP: Andrew Sailors, PA      Brief Narrative:  [name] is a 49 y.o. F with no significant past medical history who presented with dyspnea and fatigue.           Assessment & Plan:  Coronavirus pneumonitis with acute hypoxic respiratory failure Presented with dyspnea, hypoxia requiring high flow nasal cannula, and bilateral infiltrates in the setting of the ongoing 2020-2021 COVID-19 pandemic.  -Continue remdesivir -Continue steroids -VTE PPx with Lovenox       . albuterol  4 puff Inhalation Q6H  . Chlorhexidine Gluconate Cloth  6 each Topical Daily  . dexamethasone (DECADRON) injection  6 mg Intravenous Daily  . enoxaparin (LOVENOX) injection  80 mg Subcutaneous Q24H  . insulin aspart  0-15 Units Subcutaneous TID WC  . mouth rinse  15 mL Mouth Rinse BID  . pantoprazole  40 mg Oral Daily   Recent Labs  Lab 01/18/20 1455 01/19/20 0242 01/20/20 0412  NA 137 142 138  K 3.9 4.4 4.0  CO2 33* 33* 33*  BUN 31* 37* 29*  CREATININE 1.58* 1.50* 1.13  MG  --  3.3* 2.9*  WBC 5.6 4.7 6.1  HGB 13.4 13.3 13.0  PLT 229 240 283    No outpatient medications have been marked as taking for the 01/18/20 encounter [facility] Encounter).         Disposition: The patient was admitted with COVID-19.   I will discharge when he is completed remdesivir and his oxygen has been weaned off.        DVT prophylaxis: Lovenox Code Status: FULL  Family Communication:  MDM: The below labs and imaging reports were reviewed and summarized above.  Medication management as above. This is a severe acute illness with threat to life.   Procedures:     Antimicrobials:      Culture data:        Subjective: Patient is fatigued with walking to the bathroom, but otherwise feels well.  No cough, confusion, fever.  Objective: Vitals:   01/19/20 2108 01/20/20 0342 01/20/20 0653  01/20/20 1234  BP: (!) 131/94 131/74  117/81  Pulse: 78 80  75  Resp: 18 16  20   Temp: 98.5 F (36.9 C) 97.9 F (36.6 C)  97.8 F (36.6 C)  TempSrc: Oral Oral  Oral  SpO2: (!) 87% (!) 86%  94%  Weight:   47.8 kg   Height:        Intake/Output Summary (Last 24 hours) at 01/20/2020 1829 Last data filed at 01/20/2020 1819 Gross per 24 hour  Intake 1160 ml  Output --  Net 1160 ml   Filed Weights   01/18/20 1408 01/18/20 2100 01/20/20 0653  Weight: (!) 158.8 kg (!) 169 kg 47.8 kg    Examination: General appearance: Obese adult male, alert and in no acute distress.  Sitting in recliner, on 5 L high flow nasal cannula. HEENT: Anicteric, conjunctiva pink, lids and lashes normal. No nasal deformity, discharge, epistaxis.  Lips moist.   Skin: Warm and dry.  No jaundice.  No suspicious rashes or lesions. Cardiac: RRR, nl S1-S2, no murmurs appreciated.  Capillary refill is brisk  JVP normal.  No LE edema.  Radial  pulses 2+ and symmetric. Respiratory: Tachypneic, no accessory muscle use, diminished.  CTAB without rales or wheezes. Abdomen: Abdomen soft.  No TTP or guarding. No ascites, distension, hepatosplenomegaly.  MSK: No deformities or effusions. Neuro: Awake and alert.  EOMI, moves all extremities. Speech fluent.    Psych: Sensorium intact and responding to questions, attention normal. Affect normal.  Judgment and insight appear normal.          Data Reviewed: I have personally reviewed following labs and imaging studies:  CBC: Recent Labs  Lab 01/18/20 1455 01/19/20 0242 01/20/20 0412  WBC 5.6 4.7 6.1  NEUTROABS 4.2 3.7 4.7  HGB 13.4 13.3 13.0  HCT 42.0 42.8 42.0  MCV 84.8 86.1 87.9  PLT 229 240 283   Basic Metabolic Panel: Recent Labs  Lab 01/18/20 1455 01/19/20 0242 01/20/20 0412  NA 137 142 138  K 3.9 4.4 4.0  CL 91* 94* 95*  CO2 33* 33* 33*  GLUCOSE 135* 170* 150*  BUN 31* 37* 29*  CREATININE 1.58* 1.50* 1.13  CALCIUM 8.2* 8.3* 8.2*  MG  --   3.3* 2.9*  PHOS  --  5.6* 4.2   GFR: Estimated Creatinine Clearance: 54.6 mL/min (by C-G formula based on SCr of 1.13 mg/dL). Liver Function Tests: Recent Labs  Lab 01/18/20 1455 01/19/20 0242 01/20/20 0412  AST 44* 38 34  ALT 25 25 24   ALKPHOS 45 48 45  BILITOT 0.6 0.4 0.5  PROT 7.5 7.8 6.9  ALBUMIN 3.2* 3.2* 2.8*   No results for input(s): LIPASE, AMYLASE in the last 168 hours. No results for input(s): AMMONIA in the last 168 hours. Coagulation Profile: No results for input(s): INR, PROTIME in the last 168 hours. Cardiac Enzymes: No results for input(s): CKTOTAL, CKMB, CKMBINDEX, TROPONINI in the last 168 hours. BNP (last 3 results) No results for input(s): PROBNP in the last 8760 hours. HbA1C: Recent Labs    01/19/20 1128  HGBA1C 7.0*   CBG: Recent Labs  Lab 01/19/20 2330 01/20/20 0337 01/20/20 0953 01/20/20 1230 01/20/20 1658  GLUCAP 134* 143* 128* 139* 161*   Lipid Profile: Recent Labs    01/18/20 1455  TRIG 120   Thyroid Function Tests: No results for input(s): TSH, T4TOTAL, FREET4, T3FREE, THYROIDAB in the last 72 hours. Anemia Panel: Recent Labs    01/19/20 0242 01/20/20 0412  FERRITIN 211 232   Urine analysis: No results found for: COLORURINE, APPEARANCEUR, LABSPEC, PHURINE, GLUCOSEU, HGBUR, BILIRUBINUR, KETONESUR, PROTEINUR, UROBILINOGEN, NITRITE, LEUKOCYTESUR Sepsis Labs: @LABRCNTIP (procalcitonin:4,lacticacidven:4)  ) Recent Results (from the past 240 hour(s))  Blood Culture (routine x 2)     Status: None (Preliminary result)   Collection Time: 01/18/20  2:50 PM   Specimen: BLOOD LEFT FOREARM  Result Value Ref Range Status   Specimen Description   Final    BLOOD LEFT FOREARM Performed at [facility], [address] [place], [place]    Special Requests   Final    BOTTLES DRAWN AEROBIC AND ANAEROBIC Blood Culture adequate volume Performed at [facility], [address]  [place], [place]    Culture   Final    NO GROWTH 2 DAYS Performed at [facility], [address] [place], [address] [place]    Report Status PENDING  Incomplete  Blood Culture (routine x 2)     Status: None (Preliminary result)   Collection Time: 01/18/20  2:55 PM   Specimen: BLOOD LEFT FOREARM  Result Value Ref Range Status   Specimen Description   Final    BLOOD LEFT FOREARM Performed at [facility], [address], [name], [place]  Special Requests   Final    BOTTLES DRAWN AEROBIC AND ANAEROBIC Blood Culture results may not be optimal due to an excessive volume of blood received in culture bottles Performed at [facility], [address], [place], [place] [address]    Culture   Final    NO GROWTH 2 DAYS Performed at [facility], [address] [place], [place] [address]    Report Status PENDING  Incomplete  MRSA PCR Screening     Status: None   Collection Time: 01/18/20  7:02 PM   Specimen: Nasopharyngeal  Result Value Ref Range Status   MRSA by PCR NEGATIVE NEGATIVE Final    Comment:        The GeneXpert MRSA Assay (FDA approved for NASAL specimens only), is one component of a comprehensive MRSA colonization surveillance program. It is not intended to diagnose MRSA infection nor to guide or monitor treatment for MRSA infections. Performed at [facility], [address] [place], [place] [address]          Radiology Studies: CT HEAD WO CONTRAST  Result Date: 01/19/2020 CLINICAL DATA:  49 year old male positive COVID-19. Altered mental status and confusion. EXAM: CT HEAD WITHOUT CONTRAST TECHNIQUE: Contiguous axial images were obtained from the base of the skull through the vertex without intravenous contrast. COMPARISON:  None. FINDINGS: Brain: There is nearly symmetric abnormal hypodensity in the bilateral globus pallidus (series 2, image 16). No associated hemorrhage or  mass effect. Underlying cerebral volume is within normal limits. Outside of the globus pallidus Gray-white matter differentiation is within normal limits throughout the brain. No midline shift, ventriculomegaly, mass effect, evidence of mass lesion, intracranial hemorrhage or evidence of cortically based acute infarction. No cortical encephalomalacia identified. Vascular: Mild Calcified atherosclerosis at the skull base. No suspicious intracranial vascular hyperdensity. Skull: Negative. Sinuses/Orbits: Minimal paranasal sinus mucosal thickening, and posterior right ethmoid bubbly opacity. Tympanic cavities and mastoids are clear. No fluid levels. Other: Negative orbit and scalp soft tissues. IMPRESSION: 1. Abnormal but age indeterminate hypodensity in the bilateral globus pallidus. Such a pattern can be seen in the setting of toxic exposure or ingestion (e.g. carbon monoxide), and anoxia. Brain MRI without contrast may characterize further. 2. Elsewhere normal noncontrast CT appearance of the brain. 3. Mild paranasal sinus inflammation, significance doubtful. Electronically Signed   By: Odessa Fleming M.D.   On: 01/19/2020 19:18        Scheduled Meds: . albuterol  4 puff Inhalation Q6H  . Chlorhexidine Gluconate Cloth  6 each Topical Daily  . dexamethasone (DECADRON) injection  6 mg Intravenous Daily  . enoxaparin (LOVENOX) injection  80 mg Subcutaneous Q24H  . insulin aspart  0-15 Units Subcutaneous TID WC  . mouth rinse  15 mL Mouth Rinse BID  . pantoprazole  40 mg Oral Daily   Continuous Infusions: . remdesivir 100 mg in NS 100 mL 100 mg (01/20/20 1003)     LOS: 2 days    Time spent: 35 minutes      Alberteen Sam, MD [facility] 01/20/2020, 6:29 PM     Please page through AMION:  [email or web address] Contact charge nurse for password If 7PM-7AM, please contact night-coverage

## 2020-01-20 NOTE — Evaluation (Signed)
Physical Therapy Evaluation-1x Patient Details Name: Andrew [name] MRN: [id number] DOB: 1971-06-02 Today's Date: 01/20/2020   History of Present Illness  49 yo male admitted with COVID Pna, encephalopathy. Hx of obesity  Clinical Impression  On eval, pt was Mod Ind with mobility. He walked ~40 feet around room. O2 85% on 6L [place] O2, dyspnea 2/4 with ambulation. Pt does not have any acute PT needs. 1x eval. Will sign off. Recommend nursing perform O2 assessments as neccessary.     Follow Up Recommendations No PT follow up    Equipment Recommendations  None recommended by PT    Recommendations for Other Services       Precautions / Restrictions Precautions Precaution Comments: monitor O2; airborne Restrictions Weight Bearing Restrictions: No      Mobility  Bed Mobility               General bed mobility comments: oob in recliner  Transfers Overall transfer level: Modified independent   Transfers: Sit to/from Stand           General transfer comment: assist only for lines/equipment  Ambulation/Gait Ambulation/Gait assistance: Modified independent (Device/Increase time) Gait Distance (Feet): 40 Feet Assistive device: None Gait Pattern/deviations: WFL(Within Functional Limits)     General Gait Details: assist only for lines/equipmenet. O2 85% on 6L [place]  Stairs            Wheelchair Mobility    Modified Rankin (Stroke Patients Only)       Balance Overall balance assessment: No apparent balance deficits (not formally assessed)                                           Pertinent Vitals/Pain Pain Assessment: No/denies pain    Home Living Family/patient expects to be discharged to:: Private residence Living Arrangements: Spouse/significant other Available Help at Discharge: Family Type of Home: House       Home Layout: Two level;Bed/bath upstairs Home Equipment: None      Prior Function Level of Independence: Independent                Hand Dominance        Extremity/Trunk Assessment   Upper Extremity Assessment Upper Extremity Assessment: Overall WFL for tasks assessed    Lower Extremity Assessment Lower Extremity Assessment: Overall WFL for tasks assessed    Cervical / Trunk Assessment Cervical / Trunk Assessment: Normal  Communication   Communication: No difficulties  Cognition Arousal/Alertness: Awake/alert Behavior During Therapy: WFL for tasks assessed/performed Overall Cognitive Status: Within Functional Limits for tasks assessed                                        General Comments      Exercises     Assessment/Plan    PT Assessment Patent does not need any further PT services  PT Problem List         PT Treatment Interventions      PT Goals (Current goals can be found in the Care Plan section)  Acute Rehab PT Goals Patient Stated Goal: home soon. PT Goal Formulation: All assessment and education complete, DC therapy    Frequency     Barriers to discharge        Co-evaluation  AM-PAC PT "6 Clicks" Mobility  Outcome Measure Help needed turning from your back to your side while in a flat bed without using bedrails?: None Help needed moving from lying on your back to sitting on the side of a flat bed without using bedrails?: None Help needed moving to and from a bed to a chair (including a wheelchair)?: None Help needed standing up from a chair using your arms (e.g., wheelchair or bedside chair)?: None Help needed to walk in hospital room?: None Help needed climbing 3-5 steps with a railing? : A Little 6 Click Score: 23    End of Session Equipment Utilized During Treatment: Oxygen Activity Tolerance: Patient tolerated treatment well Patient left: in chair;with call bell/phone within reach        Time: 1454-1519 PT Time Calculation (min) (ACUTE ONLY): 25 min   Charges:   PT Evaluation $PT Eval Low Complexity: 1  Low             Tanylah Schnoebelen P, PT Acute Rehabilitation

## 2020-01-21 LAB — COMPREHENSIVE METABOLIC PANEL
ALT: 24 U/L (ref 0–44)
AST: 28 U/L (ref 15–41)
Albumin: 2.9 g/dL — ABNORMAL LOW (ref 3.5–5.0)
Alkaline Phosphatase: 40 U/L (ref 38–126)
Anion gap: 7 (ref 5–15)
BUN: 19 mg/dL (ref 6–20)
CO2: 37 mmol/L — ABNORMAL HIGH (ref 22–32)
Calcium: 8.3 mg/dL — ABNORMAL LOW (ref 8.9–10.3)
Chloride: 95 mmol/L — ABNORMAL LOW (ref 98–111)
Creatinine, Ser: 0.89 mg/dL (ref 0.61–1.24)
GFR calc Af Amer: >60 mL/min (ref >60)
GFR calc non Af Amer: >60 mL/min (ref >60)
Glucose, Bld: 129 mg/dL — ABNORMAL HIGH (ref 70–99)
Potassium: 4.3 mmol/L (ref 3.5–5.1)
Sodium: 139 mmol/L (ref 135–145)
Total Bilirubin: 0.5 mg/dL (ref 0.3–1.2)
Total Protein: 6.6 g/dL (ref 6.5–8.1)

## 2020-01-21 LAB — PHOSPHORUS: Phosphorus: 4.2 mg/dL (ref 2.5–4.6)

## 2020-01-21 LAB — CBC WITH DIFFERENTIAL/PLATELET
Abs Immature Granulocytes: 0.08 10*3/uL — ABNORMAL HIGH (ref 0.00–0.07)
Basophils Absolute: 0 10*3/uL (ref 0.0–0.1)
Basophils Relative: 0 %
Eosinophils Absolute: 0 10*3/uL (ref 0.0–0.5)
Eosinophils Relative: 0 %
HCT: 43.4 % (ref 39.0–52.0)
Hemoglobin: 13.4 g/dL (ref 13.0–17.0)
Immature Granulocytes: 1 %
Lymphocytes Relative: 15 %
Lymphs Abs: 1 10*3/uL (ref 0.7–4.0)
MCH: 27.2 pg (ref 26.0–34.0)
MCHC: 30.9 g/dL (ref 30.0–36.0)
MCV: 88 fL (ref 80.0–100.0)
Monocytes Absolute: 0.5 10*3/uL (ref 0.1–1.0)
Monocytes Relative: 8 %
Neutro Abs: 5 10*3/uL (ref 1.7–7.7)
Neutrophils Relative %: 76 %
Platelets: 280 10*3/uL (ref 150–400)
RBC: 4.93 MIL/uL (ref 4.22–5.81)
RDW: 14.6 % (ref 11.5–15.5)
WBC: 6.6 10*3/uL (ref 4.0–10.5)
nRBC: 0 % (ref 0.0–0.2)

## 2020-01-21 LAB — MAGNESIUM: Magnesium: 2.8 mg/dL — ABNORMAL HIGH (ref 1.7–2.4)

## 2020-01-21 LAB — D-DIMER, QUANTITATIVE: D-Dimer, Quant: 0.69 ug/mL-FEU — ABNORMAL HIGH (ref 0.00–0.50)

## 2020-01-21 LAB — GLUCOSE, CAPILLARY
Glucose-Capillary: 107 mg/dL — ABNORMAL HIGH (ref 70–99)
Glucose-Capillary: 145 mg/dL — ABNORMAL HIGH (ref 70–99)
Glucose-Capillary: 157 mg/dL — ABNORMAL HIGH (ref 70–99)
Glucose-Capillary: 136 mg/dL — ABNORMAL HIGH (ref 70–99)

## 2020-01-21 LAB — FERRITIN: Ferritin: 212 ng/mL (ref 24–336)

## 2020-01-21 LAB — C-REACTIVE PROTEIN: CRP: 6.8 mg/dL — ABNORMAL HIGH (ref <1.0)

## 2020-01-21 NOTE — Progress Notes (Signed)
PROGRESS NOTE    Andrew [name]  BLT:[id number] DOB: July 14, 1971 DOA: 01/18/2020 PCP: Norm Salt, PA      Brief Narrative:  [name] is a 49 y.o. F with no significant past medical history who presented with dyspnea and fatigue.           Assessment & Plan:  Coronavirus pneumonitis with acute hypoxic respiratory failure Presented with dyspnea, hypoxia requiring high flow nasal cannula, and bilateral infiltrates in the setting of the ongoing 2020-2021 COVID-19 pandemic.  -Continue remdesivir, day 4 of 5 -Continue steroids, day 4 -Continue VTE PPx with Lovenox  Likely sleep apnea -Outpatient sleep study  Hyperglycemia, likely diabetes Hemoglobin A1c 7% here, no previous history of diabetes -Continue sliding scale corrections -Follow-up confirmatory hemoglobin A1c with PCP   Time Creatinine resolved     Disposition: Patient was admitted with COVID-19.  He continues to require high levels of supplemental oxygen, required 10 L overnight, weaned to 5 L throughout the day today, this evening able to wean down to room air briefly.  I will discharge when he has completed remdesivir and his oxygen requirements are weaned down to a low level.        DVT prophylaxis: Lovenox Code Status: FULL  Family Communication:   MDM:  The below labs and imaging reports reviewed and summarized above.  Medication management as above.           Subjective: Patient feels much better.  Mild cough, mild shortness of breath with exertion, no confusion, syncope, chest pain, sputum, hemoptysis.  Objective: Vitals:   01/20/20 2126 01/21/20 0356 01/21/20 1124 01/21/20 1217  BP:  119/61  (!) 105/93  Pulse: 72 67  80  Resp:  20    Temp:  97.7 F (36.5 C)  98.9 F (37.2 C)  TempSrc:  Oral  Oral  SpO2: 94% 92% 92% (!) 85%  Weight:      Height:        Intake/Output Summary (Last 24 hours) at 01/21/2020 2046 Last data filed at 01/21/2020 1100 Gross per 24 hour   Intake 720 ml  Output --  Net 720 ml   Filed Weights   01/18/20 1408 01/18/20 2100 01/20/20 0653  Weight: (!) 158.8 kg (!) 169 kg 47.8 kg    Examination: General appearance: Obese adult male, alert and in no acute distress.  Sitting up in bed  HEENT: Anicteric, conjunctiva pink, lids and lashes normal. No nasal deformity, discharge, epistaxis.  Lips moist, teeth normal. OP moist, no oral lesions.   Skin: Warm and dry.  No suspicious rashes or lesions. Cardiac: RRR, no murmurs appreciated.  No LE edema.    Respiratory: Normal respiratory rate and rhythm.  CTAB without rales or wheezes. Abdomen: Abdomen soft.  No tenderness palpation or guarding. No ascites, distension, hepatosplenomegaly.   MSK: No deformities or effusions of the large joints of the upper or lower extremities bilaterally. Neuro: Awake and alert. Naming is grossly intact, and the patient's recall, recent and remote, as well as general fund of knowledge seem within normal limits.  Muscle tone normal, without fasciculations.  Moves all extremities equally and with normal coordination.  Speech fluent.    Psych: Sensorium intact and responding to questions, attention normal. Affect normal.  Judgment and insight appear normal.            Data Reviewed: I have personally reviewed following labs and imaging studies:  CBC: Recent Labs  Lab 01/18/20 1455 01/19/20 0242 01/20/20  2878 01/21/20 0319  WBC 5.6 4.7 6.1 6.6  NEUTROABS 4.2 3.7 4.7 5.0  HGB 13.4 13.3 13.0 13.4  HCT 42.0 42.8 42.0 43.4  MCV 84.8 86.1 87.9 88.0  PLT 229 240 283 280   Basic Metabolic Panel: Recent Labs  Lab 01/18/20 1455 01/19/20 0242 01/20/20 0412 01/21/20 0319  NA 137 142 138 139  K 3.9 4.4 4.0 4.3  CL 91* 94* 95* 95*  CO2 33* 33* 33* 37*  GLUCOSE 135* 170* 150* 129*  BUN 31* 37* 29* 19  CREATININE 1.58* 1.50* 1.13 0.89  CALCIUM 8.2* 8.3* 8.2* 8.3*  MG  --  3.3* 2.9* 2.8*  PHOS  --  5.6* 4.2 4.2   GFR: Estimated  Creatinine Clearance: 69.4 mL/min (by C-G formula based on SCr of 0.89 mg/dL). Liver Function Tests: Recent Labs  Lab 01/18/20 1455 01/19/20 0242 01/20/20 0412 01/21/20 0319  AST 44* 38 34 28  ALT 25 25 24 24   ALKPHOS 45 48 45 40  BILITOT 0.6 0.4 0.5 0.5  PROT 7.5 7.8 6.9 6.6  ALBUMIN 3.2* 3.2* 2.8* 2.9*   No results for input(s): LIPASE, AMYLASE in the last 168 hours. No results for input(s): AMMONIA in the last 168 hours. Coagulation Profile: No results for input(s): INR, PROTIME in the last 168 hours. Cardiac Enzymes: No results for input(s): CKTOTAL, CKMB, CKMBINDEX, TROPONINI in the last 168 hours. BNP (last 3 results) No results for input(s): PROBNP in the last 8760 hours. HbA1C: Recent Labs    01/19/20 1128  HGBA1C 7.0*   CBG: Recent Labs  Lab 01/20/20 1658 01/20/20 2017 01/21/20 0746 01/21/20 1205 01/21/20 1652  GLUCAP 161* 217* 107* 145* 157*   Lipid Profile: No results for input(s): CHOL, HDL, LDLCALC, TRIG, CHOLHDL, LDLDIRECT in the last 72 hours. Thyroid Function Tests: No results for input(s): TSH, T4TOTAL, FREET4, T3FREE, THYROIDAB in the last 72 hours. Anemia Panel: Recent Labs    01/20/20 0412 01/21/20 0319  FERRITIN 232 212   Urine analysis: No results found for: COLORURINE, APPEARANCEUR, LABSPEC, PHURINE, GLUCOSEU, HGBUR, BILIRUBINUR, KETONESUR, PROTEINUR, UROBILINOGEN, NITRITE, LEUKOCYTESUR Sepsis Labs: @LABRCNTIP (procalcitonin:4,lacticacidven:4)  ) Recent Results (from the past 240 hour(s))  Blood Culture (routine x 2)     Status: None (Preliminary result)   Collection Time: 01/18/20  2:50 PM   Specimen: BLOOD LEFT FOREARM  Result Value Ref Range Status   Specimen Description   Final    BLOOD LEFT FOREARM Performed at [facility], [address], [place], [place]    Special Requests   Final    BOTTLES DRAWN AEROBIC AND ANAEROBIC Blood Culture adequate volume Performed at [facility], [address] [place], [place]    Culture   Final    NO GROWTH 3 DAYS Performed at [facility], [address], [place], [address] [place]    Report Status PENDING  Incomplete  Blood Culture (routine x 2)     Status: None (Preliminary result)   Collection Time: 01/18/20  2:55 PM   Specimen: BLOOD LEFT FOREARM  Result Value Ref Range Status   Specimen Description   Final    BLOOD LEFT FOREARM Performed at [facility], [address] [place], [place]    Special Requests   Final    BOTTLES DRAWN AEROBIC AND ANAEROBIC Blood Culture results may not be optimal due to an excessive volume of blood received in culture bottles Performed at [facility]  Hospital, [place] [address] [place], [place] [address]    Culture   Final    NO GROWTH 3 DAYS Performed at [facility], [place] [address] [place], [place] [address]    Report Status PENDING  Incomplete  MRSA PCR Screening     Status: None   Collection Time: 01/18/20  7:02 PM   Specimen: Nasopharyngeal  Result Value Ref Range Status   MRSA by PCR NEGATIVE NEGATIVE Final    Comment:        The GeneXpert MRSA Assay (FDA approved for NASAL specimens only), is one component of a comprehensive MRSA colonization surveillance program. It is not intended to diagnose MRSA infection nor to guide or monitor treatment for MRSA infections. Performed at [facility], [place] [address], [place],  [address]          Radiology Studies: MR BRAIN WO CONTRAST  Result Date: 01/20/2020 CLINICAL DATA:  49 year old male positive COVID-19. Abnormal hypodensity in the bilateral globus pallidus on head CT yesterday. Altered mental status and confusion. EXAM: MRI HEAD WITHOUT CONTRAST TECHNIQUE: Multiplanar, multiecho pulse sequences of the brain and surrounding structures were obtained without intravenous contrast. COMPARISON:  Plain head CT yesterday.  FINDINGS: Brain: Confluent and intense restricted diffusion in the bilateral globus pallidus with associated T2 and FLAIR hyperintensity (series 7, image 72, series 10, image 25). No associated hemorrhage or mass effect. Furthermore there is indistinct, patchy restricted diffusion in the bilateral deep cerebral white matter in a fairly symmetric pattern (series 7, images 81 and 78) with less pronounced associated T2 and FLAIR hyperintensity. No superimposed cortical restricted diffusion or encephalomalacia. The remaining deep gray nuclei are within normal limits. Negative brainstem and cerebellum. No acute or chronic blood products identified. Normal background cerebral volume. No midline shift, mass effect, evidence of mass lesion, ventriculomegaly, extra-axial collection. Cervicomedullary junction and pituitary are within normal limits. Vascular: Major intracranial vascular flow voids are preserved. Skull and upper cervical spine: Negative visible cervical spine. Visualized bone marrow signal is within normal limits. Sinuses/Orbits: Negative orbits.  Paranasal sinuses are clear. Other: Mastoids are clear. Visible internal auditory structures appear normal. Scalp and face soft tissues appear negative. IMPRESSION: 1. Symmetric abnormal signal with restricted diffusion in the bilateral globus pallidus, hemispheric white matter. - no associated hemorrhage or mass effect. - elsewhere the brain appears normal. 2. Suspect these are acute neurologic manifestations of COVID-19, observed patterns of which will frequently include areas of: Cytotoxic leukoencephalopathy, tumefactive demyelination, and hypoxic injury. Electronically Signed   By: Genevie Ann M.D.   On: 01/20/2020 21:22        Scheduled Meds: . albuterol  4 puff Inhalation Q6H  . Chlorhexidine Gluconate Cloth  6 each Topical Daily  . dexamethasone (DECADRON) injection  6 mg Intravenous Daily  . enoxaparin (LOVENOX) injection  80 mg Subcutaneous Q24H  .  insulin aspart  0-15 Units Subcutaneous TID WC  . mouth rinse  15 mL Mouth Rinse BID  . pantoprazole  40 mg Oral Daily   Continuous Infusions: . remdesivir 100 mg in NS 100 mL 100 mg (01/21/20 1035)     LOS: 3 days    Time spent: 25 minutes      Edwin Dada, MD [facility] 01/21/2020, 8:46 PM     Please page through [place]:  [email or web address] Contact charge nurse for password If 7PM-7AM, please contact night-coverage

## 2020-01-22 DIAGNOSIS — U071 COVID-19: Principal | ICD-10-CM

## 2020-01-22 DIAGNOSIS — J1282 Pneumonia due to Coronavirus disease 2019: Secondary | ICD-10-CM

## 2020-01-22 LAB — MAGNESIUM: Magnesium: 2.3 mg/dL (ref 1.7–2.4)

## 2020-01-22 LAB — CBC WITH DIFFERENTIAL/PLATELET
Abs Immature Granulocytes: 0.09 10*3/uL — ABNORMAL HIGH (ref 0.00–0.07)
Basophils Absolute: 0 10*3/uL (ref 0.0–0.1)
Basophils Relative: 0 %
Eosinophils Absolute: 0 10*3/uL (ref 0.0–0.5)
Eosinophils Relative: 0 %
HCT: 44.5 % (ref 39.0–52.0)
Hemoglobin: 13.9 g/dL (ref 13.0–17.0)
Immature Granulocytes: 1 %
Lymphocytes Relative: 20 %
Lymphs Abs: 1.3 10*3/uL (ref 0.7–4.0)
MCH: 27 pg (ref 26.0–34.0)
MCHC: 31.2 g/dL (ref 30.0–36.0)
MCV: 86.6 fL (ref 80.0–100.0)
Monocytes Absolute: 0.5 10*3/uL (ref 0.1–1.0)
Monocytes Relative: 8 %
Neutro Abs: 4.7 10*3/uL (ref 1.7–7.7)
Neutrophils Relative %: 71 %
Platelets: 328 10*3/uL (ref 150–400)
RBC: 5.14 MIL/uL (ref 4.22–5.81)
RDW: 14.3 % (ref 11.5–15.5)
WBC: 6.6 10*3/uL (ref 4.0–10.5)
nRBC: 0 % (ref 0.0–0.2)

## 2020-01-22 LAB — COMPREHENSIVE METABOLIC PANEL
ALT: 33 U/L (ref 0–44)
AST: 42 U/L — ABNORMAL HIGH (ref 15–41)
Albumin: 3 g/dL — ABNORMAL LOW (ref 3.5–5.0)
Alkaline Phosphatase: 45 U/L (ref 38–126)
Anion gap: 11 (ref 5–15)
BUN: 17 mg/dL (ref 6–20)
CO2: 34 mmol/L — ABNORMAL HIGH (ref 22–32)
Calcium: 8.7 mg/dL — ABNORMAL LOW (ref 8.9–10.3)
Chloride: 95 mmol/L — ABNORMAL LOW (ref 98–111)
Creatinine, Ser: 0.9 mg/dL (ref 0.61–1.24)
GFR calc Af Amer: >60 mL/min (ref >60)
GFR calc non Af Amer: >60 mL/min (ref >60)
Glucose, Bld: 118 mg/dL — ABNORMAL HIGH (ref 70–99)
Potassium: 3.8 mmol/L (ref 3.5–5.1)
Sodium: 140 mmol/L (ref 135–145)
Total Bilirubin: 0.7 mg/dL (ref 0.3–1.2)
Total Protein: 6.7 g/dL (ref 6.5–8.1)

## 2020-01-22 LAB — GLUCOSE, CAPILLARY
Glucose-Capillary: 101 mg/dL — ABNORMAL HIGH (ref 70–99)
Glucose-Capillary: 136 mg/dL — ABNORMAL HIGH (ref 70–99)

## 2020-01-22 LAB — C-REACTIVE PROTEIN: CRP: 3.8 mg/dL — ABNORMAL HIGH (ref <1.0)

## 2020-01-22 LAB — PHOSPHORUS: Phosphorus: 2.6 mg/dL (ref 2.5–4.6)

## 2020-01-22 LAB — D-DIMER, QUANTITATIVE: D-Dimer, Quant: 0.63 ug/mL-FEU — ABNORMAL HIGH (ref 0.00–0.50)

## 2020-01-22 LAB — FERRITIN: Ferritin: 215 ng/mL (ref 24–336)

## 2020-01-22 MED ORDER — DEXAMETHASONE 6 MG PO TABS: 6.0000 mg | ORAL_TABLET | Freq: Every day | ORAL | 0 refills | Status: AC

## 2020-01-22 MED ORDER — DEXAMETHASONE 6 MG PO TABS: 6.0000 mg | ORAL_TABLET | Freq: Two times a day (BID) | ORAL | 0 refills | Status: DC

## 2020-01-22 NOTE — TOC Progression Note (Signed)
Transition of Care [facility]) - Progression Note    Patient Details  Name: Andrew [name] MRN: [id number] Date of Birth: Apr 20, 1971  Transition of [facility]) CM/SW Contact  Geni Bers, RN Phone Number: 01/22/2020, 11:45 AM  Clinical Narrative:    Checking on O2 for pt. Pt is self pay.    Expected Discharge Plan: Home/Self Care Barriers to Discharge: No Barriers Identified  Expected Discharge Plan and Services Expected Discharge Plan: Home/Self Care       Living arrangements for the past 2 months: Single Family Home                                       Social Determinants of Health (SDOH) Interventions    Readmission Risk Interventions No flowsheet data found.

## 2020-01-22 NOTE — Progress Notes (Signed)
SATURATION QUALIFICATIONS: (This note is used to comply with regulatory documentation for home oxygen)  Patient Saturations on Room Air at Rest = 87%  Patient Saturations on Room Air while Ambulating = 83-86%  Patient Saturations on 4 Liters of oxygen while Ambulating = 88-94%  Please briefly explain why patient needs home oxygen:  Pt O2 sats below 88% while at rest and ambulating.

## 2020-01-22 NOTE — Discharge Summary (Signed)
Physician Discharge Summary  Andrew [name] UGQ:[id number] DOB: June 26, 1971 DOA: 01/18/2020  PCP: Andrew Salt, PA  Admit date: 01/18/2020 Discharge date: 01/22/2020  Admitted From: Home  Disposition:  Home   Recommendations for Outpatient Follow-up:  1. Follow up with PCP Andrew [name] in 1-2 weeks 2. Ms Andrew [name]: Please wean O2 as able 3. Ms Andrew [name]: Please repeat HgbA1c in 3 months to confirm diabetes 4. Ms Andrew [name]: Refer for PSG sleep study when able       Home Health: None  Equipment/Devices: O2  Discharge Condition: Fair  CODE STATUS: FULL Diet recommendation: REgular  Brief/Interim Summary: [name] is a 49 y.o. F with no significant past medical history who presented with dyspnea and fatigue.     PRINCIPAL HOSPITAL DIAGNOSIS: COVID-19    Discharge Diagnoses:   Coronavirus pneumonitis with acute hypoxic respiratory failure Presented with dyspnea, hypoxia requiring high flow nasal cannula, and bilateral infiltrates in the setting of the ongoing 2020-2021 COVID-19 pandemic.  Patient treated with 5 days remdesivir.  Discharged to complete 10 days dexamethasone.  Patient weaned consistently to 2-3L at rest and with ambulation.     Likely sleep apnea -Outpatient sleep study  Hyperglycemia, likely diabetes Hemoglobin A1c 7% here, no previous history of diabetes. -Follow-up confirmatory hemoglobin A1c with PCP   AKI Creatinine 1.6 at admission, resolved to normal with fluids          Discharge Instructions  Discharge Instructions    Discharge instructions   Complete by: As directed    You were admitted for coronavirus (Also known as COVID-19)  You were treated with an anti-virus medicine ("remdesivir") and an anti-inflammatory (a "steroid") while you were here.  You completed the course of the anti-virus medicine, remdesivir  You should finish the course of steroids by taking dexamethasone once daily for 5 more days  If you have  any lingering cough, you should take the cough syrup we gave you here, Robitussin (with the ingredients "GUIAFENESIN" and "DEXTROMETHORPHAN")  Use the oxygen all the time until you see your primary care doctor. The purpose of the  oxygen is to keep your oxygen level at 88% or above Use a pulse oximeter to measure your oxygen level.  If the home health agency doesn't bring you one of these, you can find them at any drug store.    HOW LONG TO REMAIN IN QUARANTINE: There is no absolutely correct answer to this and so our best answer is to be on the cautious side.  Based on what we know of the virus, you should isolate strictly until 21 days from your first symptoms.  Until you end your quarantine: If you have anyone in the home who has NOT had coronavirus:    -do not be in the same room with them until your self isolation is over    -if you MUST be in the same room, make sure you wear a mask and have them wear a mask and safety glasses (if available)    -clean all hard surfaces (counters, doors, tables) twice a day    -use a separate bathroom at all times   Increase activity slowly   Complete by: As directed      Allergies as of 01/22/2020   No Known Allergies     Medication List    TAKE these medications   dexamethasone 6 MG tablet Commonly known as: DECADRON Take 1 tablet (6 mg total) by mouth daily.  Durable Medical Equipment  (From admission, onward)         Start     Ordered   01/21/20 2210  DME Oxygen  (Discharge Planning)  Once    Question Answer Comment  Length of Need 6 Months   Mode or (Route) Nasal cannula   Liters per Minute 2   Frequency Continuous (stationary and portable oxygen unit needed)   Oxygen conserving device Yes   Oxygen delivery system Gas      01/21/20 2209          No Known Allergies  Consultations:     Procedures/Studies: CT HEAD WO CONTRAST  Result Date: 01/19/2020 CLINICAL DATA:  49 year old male positive  COVID-19. Altered mental status and confusion. EXAM: CT HEAD WITHOUT CONTRAST TECHNIQUE: Contiguous axial images were obtained from the base of the skull through the vertex without intravenous contrast. COMPARISON:  None. FINDINGS: Brain: There is nearly symmetric abnormal hypodensity in the bilateral globus pallidus (series 2, image 16). No associated hemorrhage or mass effect. Underlying cerebral volume is within normal limits. Outside of the globus pallidus Gray-white matter differentiation is within normal limits throughout the brain. No midline shift, ventriculomegaly, mass effect, evidence of mass lesion, intracranial hemorrhage or evidence of cortically based acute infarction. No cortical encephalomalacia identified. Vascular: Mild Calcified atherosclerosis at the skull base. No suspicious intracranial vascular hyperdensity. Skull: Negative. Sinuses/Orbits: Minimal paranasal sinus mucosal thickening, and posterior right ethmoid bubbly opacity. Tympanic cavities and mastoids are clear. No fluid levels. Other: Negative orbit and scalp soft tissues. IMPRESSION: 1. Abnormal but age indeterminate hypodensity in the bilateral globus pallidus. Such a pattern can be seen in the setting of toxic exposure or ingestion (e.g. carbon monoxide), and anoxia. Brain MRI without contrast may characterize further. 2. Elsewhere normal noncontrast CT appearance of the brain. 3. Mild paranasal sinus inflammation, significance doubtful. Electronically Signed   By: Genevie Ann M.D.   On: 01/19/2020 19:18   MR BRAIN WO CONTRAST  Result Date: 01/20/2020 CLINICAL DATA:  49 year old male positive COVID-19. Abnormal hypodensity in the bilateral globus pallidus on head CT yesterday. Altered mental status and confusion. EXAM: MRI HEAD WITHOUT CONTRAST TECHNIQUE: Multiplanar, multiecho pulse sequences of the brain and surrounding structures were obtained without intravenous contrast. COMPARISON:  Plain head CT yesterday. FINDINGS: Brain:  Confluent and intense restricted diffusion in the bilateral globus pallidus with associated T2 and FLAIR hyperintensity (series 7, image 72, series 10, image 25). No associated hemorrhage or mass effect. Furthermore there is indistinct, patchy restricted diffusion in the bilateral deep cerebral white matter in a fairly symmetric pattern (series 7, images 81 and 78) with less pronounced associated T2 and FLAIR hyperintensity. No superimposed cortical restricted diffusion or encephalomalacia. The remaining deep gray nuclei are within normal limits. Negative brainstem and cerebellum. No acute or chronic blood products identified. Normal background cerebral volume. No midline shift, mass effect, evidence of mass lesion, ventriculomegaly, extra-axial collection. Cervicomedullary junction and pituitary are within normal limits. Vascular: Major intracranial vascular flow voids are preserved. Skull and upper cervical spine: Negative visible cervical spine. Visualized bone marrow signal is within normal limits. Sinuses/Orbits: Negative orbits.  Paranasal sinuses are clear. Other: Mastoids are clear. Visible internal auditory structures appear normal. Scalp and face soft tissues appear negative. IMPRESSION: 1. Symmetric abnormal signal with restricted diffusion in the bilateral globus pallidus, hemispheric white matter. - no associated hemorrhage or mass effect. - elsewhere the brain appears normal. 2. Suspect these are acute neurologic manifestations of COVID-19, observed patterns of  which will frequently include areas of: Cytotoxic leukoencephalopathy, tumefactive demyelination, and hypoxic injury. Electronically Signed   By: Odessa Fleming M.D.   On: 01/20/2020 21:22   DG Chest Port 1 View  Result Date: 01/18/2020 CLINICAL DATA:  49 yo male diagnosed Covid + on March 13th. C/O increased SHOB for the past 3 days. Went to PCP for eval today. Sat at that time 67%RA. Placed on 3NC. Sat in the upper 80'S when EMS arrivedd. NRB  placed by EMS and Sat improved to.*comment was truncated*sob covid EXAM: PORTABLE CHEST 1 VIEW COMPARISON:  None. FINDINGS: Enlarged cardiac silhouette. Low lung volumes. There is patchy bilateral airspace disease. No pneumothorax. No effusions evident IMPRESSION: Patchy bilateral airspace disease consistent with COVID pneumonia Electronically Signed   By: Genevive Bi M.D.   On: 01/18/2020 15:13       Subjective: Patient has no chest pain, dyspnea, abdominal pain, vomiting, confusion, fever  Discharge Exam: Vitals:   01/22/20 1244 01/22/20 1540  BP: (!) 151/108 (!) 147/67  Pulse: 83 81  Resp: (!) 23 20  Temp: 97.9 F (36.6 C) 97.8 F (36.6 C)  SpO2: (!) 89% 90%   Vitals:   01/22/20 0505 01/22/20 1244 01/22/20 1300 01/22/20 1540  BP:  (!) 151/108  (!) 147/67  Pulse: 77 83  81  Resp:  (!) 23  20  Temp:  97.9 F (36.6 C)  97.8 F (36.6 C)  TempSrc:  Oral  Oral  SpO2: 92% (!) 89%  90%  Weight:   (!) 171.4 kg   Height:        General: Pt is alert, awake, not in acute distress Cardiovascular: RRR, nl S1-S2, no murmurs appreciated.   No LE edema.   Respiratory: Normal respiratory rate and rhythm.  CTAB without rales or wheezes. Abdominal: Abdomen soft and non-tender.  No distension or HSM.   Neuro/Psych: Strength symmetric in upper and lower extremities.  Judgment and insight appear normal.   The results of significant diagnostics from this hospitalization (including imaging, microbiology, ancillary and laboratory) are listed below for reference.     Microbiology: Recent Results (from the past 240 hour(s))  Blood Culture (routine x 2)     Status: None (Preliminary result)   Collection Time: 01/18/20  2:50 PM   Specimen: BLOOD LEFT FOREARM  Result Value Ref Range Status   Specimen Description   Final    BLOOD LEFT FOREARM Performed at [facility], [address] [place], [place] [address]    Special Requests   Final    BOTTLES DRAWN AEROBIC AND  ANAEROBIC Blood Culture adequate volume Performed at [facility], [address], [place], [place] [address]    Culture   Final    NO GROWTH 4 DAYS Performed at [facility], [address] [place], [place] [address]    Report Status PENDING  Incomplete  Blood Culture (routine x 2)     Status: None (Preliminary result)   Collection Time: 01/18/20  2:55 PM   Specimen: BLOOD LEFT FOREARM  Result Value Ref Range Status   Specimen Description   Final    BLOOD LEFT FOREARM Performed at [facility], [name], [place], [place] [address]    Special Requests   Final    BOTTLES DRAWN AEROBIC AND ANAEROBIC Blood Culture results may not be optimal due to an excessive volume of blood received in culture bottles Performed at [facility], [address] [place], [place] [address]    Culture   Final    NO GROWTH 4 DAYS Performed at [facility], [address] [place], [place] [address]    Report Status PENDING  Incomplete  MRSA PCR Screening     Status: None   Collection Time: 01/18/20  7:02 PM   Specimen: Nasopharyngeal  Result Value Ref Range Status   MRSA by PCR NEGATIVE NEGATIVE Final    Comment:        The GeneXpert MRSA Assay (FDA approved for NASAL specimens only), is one component of a comprehensive MRSA colonization surveillance program. It is not intended to diagnose MRSA infection nor to guide or monitor treatment for MRSA infections. Performed at [facility], [address] [place], [place] [address]      Labs: BNP (last 3 results) Recent Labs    01/18/20 1455  BNP 93.4   Basic Metabolic Panel: Recent Labs  Lab 01/18/20 1455 01/19/20 0242 01/20/20 0412 01/21/20 0319 01/22/20 0353  NA 137 142 138 139 140  K 3.9 4.4 4.0 4.3 3.8  CL 91* 94* 95* 95* 95*  CO2 33* 33* 33* 37* 34*  GLUCOSE 135* 170* 150* 129* 118*  BUN 31* 37* 29* 19 17  CREATININE 1.58* 1.50*  1.13 0.89 0.90  CALCIUM 8.2* 8.3* 8.2* 8.3* 8.7*  MG  --  3.3* 2.9* 2.8* 2.3  PHOS  --  5.6* 4.2 4.2 2.6   Liver Function Tests: Recent Labs  Lab 01/18/20 1455 01/19/20 0242 01/20/20 0412 01/21/20 0319 01/22/20 0353  AST 44* 38 34 28 42*  ALT 25 25 24 24  33  ALKPHOS 45 48 45 40 45  BILITOT 0.6 0.4 0.5 0.5 0.7  PROT 7.5 7.8 6.9 6.6 6.7  ALBUMIN 3.2* 3.2* 2.8* 2.9* 3.0*   No results for input(s): LIPASE, AMYLASE in the last 168 hours. No results for input(s): AMMONIA in the last 168 hours. CBC: Recent Labs  Lab 01/18/20 1455 01/19/20 0242 01/20/20 0412 01/21/20 0319 01/22/20 0353  WBC 5.6 4.7 6.1 6.6 6.6  NEUTROABS 4.2 3.7 4.7 5.0 4.7  HGB 13.4 13.3 13.0 13.4 13.9  HCT 42.0 42.8 42.0 43.4 44.5  MCV 84.8 86.1 87.9 88.0 86.6  PLT 229 240 283 280 328   Cardiac Enzymes: No results for input(s): CKTOTAL, CKMB, CKMBINDEX, TROPONINI in the last 168 hours. BNP: Invalid input(s): POCBNP CBG: Recent Labs  Lab 01/21/20 1205 01/21/20 1652 01/21/20 2204 01/22/20 0752 01/22/20 1238  GLUCAP 145* 157* 136* 101* 136*   D-Dimer Recent Labs    01/21/20 0319 01/22/20 0353  DDIMER 0.69* 0.63*   Hgb A1c No results for input(s): HGBA1C in the last 72 hours. Lipid Profile No results for input(s): CHOL, HDL, LDLCALC, TRIG, CHOLHDL, LDLDIRECT in the last 72 hours. Thyroid function studies No results for input(s): TSH, T4TOTAL, T3FREE, THYROIDAB in the last 72 hours.  Invalid input(s): FREET3 Anemia work up [facility]    01/21/20 0319 01/22/20 0353  FERRITIN 212 215   Urinalysis No results found for: COLORURINE, APPEARANCEUR, LABSPEC, PHURINE, GLUCOSEU, HGBUR, BILIRUBINUR, KETONESUR, PROTEINUR, UROBILINOGEN, NITRITE, LEUKOCYTESUR Sepsis Labs Invalid input(s): PROCALCITONIN,  WBC,  LACTICIDVEN Microbiology Recent Results (from the past 240 hour(s))  Blood Culture (routine x 2)     Status: None (Preliminary result)   Collection Time: 01/18/20  2:50 PM   Specimen:  BLOOD LEFT FOREARM  Result Value Ref Range Status   Specimen Description   Final    BLOOD  LEFT FOREARM Performed at [facility], [address] [place], [place] [address]    Special Requests   Final    BOTTLES DRAWN AEROBIC AND ANAEROBIC Blood Culture adequate volume Performed at [facility], [address], [place], [place] [address]    Culture   Final    NO GROWTH 4 DAYS Performed at [facility], [address] [place], [place] [address]    Report Status PENDING  Incomplete  Blood Culture (routine x 2)     Status: None (Preliminary result)   Collection Time: 01/18/20  2:55 PM   Specimen: BLOOD LEFT FOREARM  Result Value Ref Range Status   Specimen Description   Final    BLOOD LEFT FOREARM Performed at [facility], [address] [place], [place] [address]    Special Requests   Final    BOTTLES DRAWN AEROBIC AND ANAEROBIC Blood Culture results may not be optimal due to an excessive volume of blood received in culture bottles Performed at [facility], [address] [place], [place] [address]    Culture   Final    NO GROWTH 4 DAYS Performed at [facility], [address] [place], [place] [address]    Report Status PENDING  Incomplete  MRSA PCR Screening     Status: None   Collection Time: 01/18/20  7:02 PM   Specimen: Nasopharyngeal  Result Value Ref Range Status   MRSA by PCR NEGATIVE NEGATIVE Final    Comment:        The GeneXpert MRSA Assay (FDA approved for NASAL specimens only), is one component of a comprehensive MRSA colonization surveillance program. It is not intended to diagnose MRSA infection nor to guide or monitor treatment for MRSA infections. Performed at [facility], [address] [place], [place] [address]      Time coordinating discharge: 25 minutes      SIGNED:   Alberteen Sam, MD  [facility] 01/22/2020, 3:47 PM

## 2020-01-22 NOTE — Progress Notes (Signed)
Patient 02 sat in the low 80's on RA and when alseep. Patient put on 3L oxygen to keep 02 sat above 90. Patient do not want to use oxygen because he fear he might not go home.

## 2020-01-23 LAB — CULTURE, BLOOD (ROUTINE X 2)
Culture: NO GROWTH
Culture: NO GROWTH
Special Requests: ADEQUATE

## 2021-04-29 IMAGING — CT CT HEAD W/O CM
3 series · 15 of 47 positions shown, 18 images · non-contrast
Comparison: None.

CLINICAL DATA: 48-year-old male positive [id number]. Altered mental
status and confusion.

EXAM:
CT HEAD WITHOUT CONTRAST
TECHNIQUE: Contiguous axial images were obtained from the base of the skull
through the vertex without intravenous contrast.

[Series 2: head wo · axial · 0.41mm/px · z∈[+1404,+1534]mm · 9 of 32 slices shown, 12 images]
[im 3/32  brain]
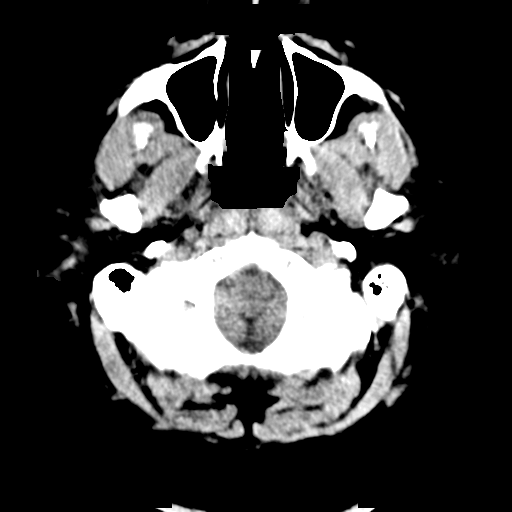
[im 3/32  bone]
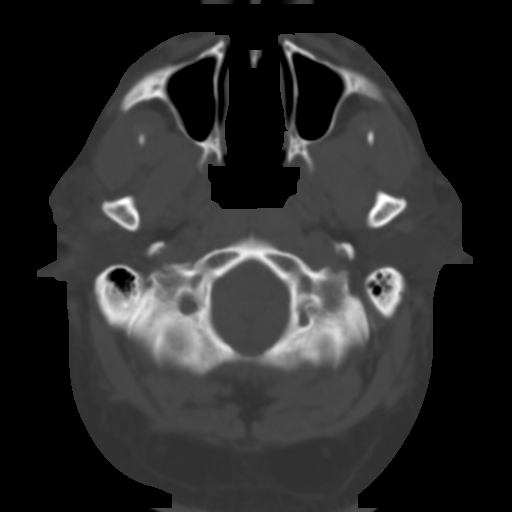
[im 6/32  brain]
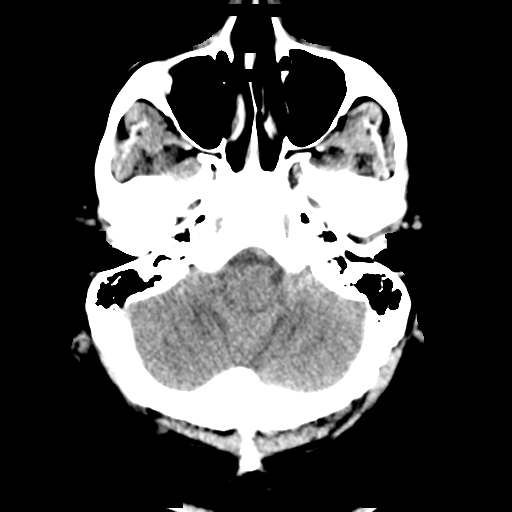
[im 9/32  brain]
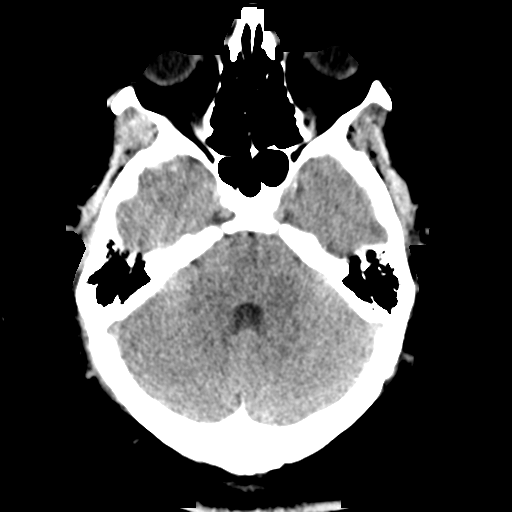
[im 12/32  brain]
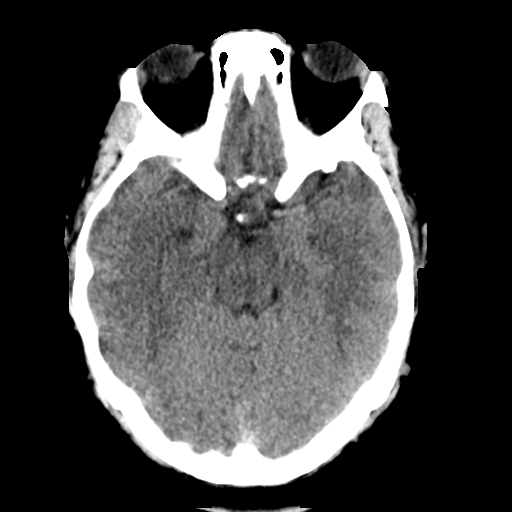
[im 17/32  brain]
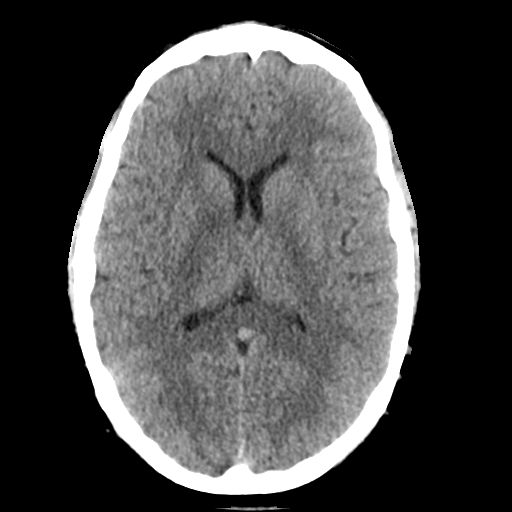
[im 17/32  bone]
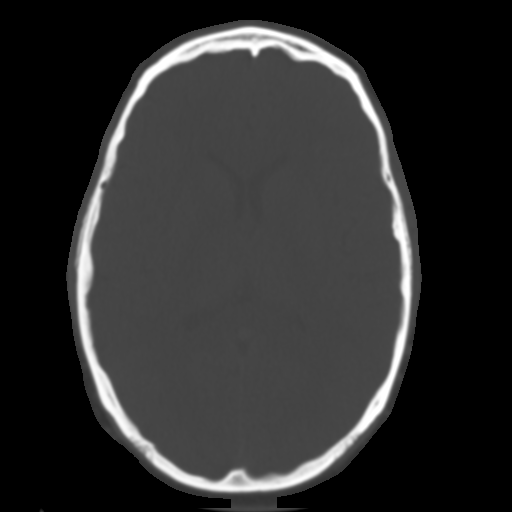
[im 20/32  brain]
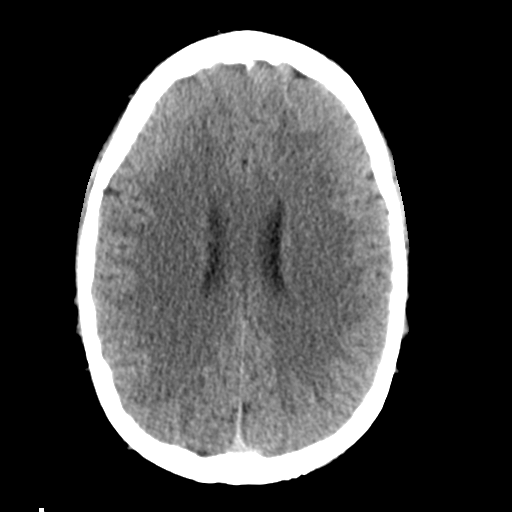
[im 23/32  brain]
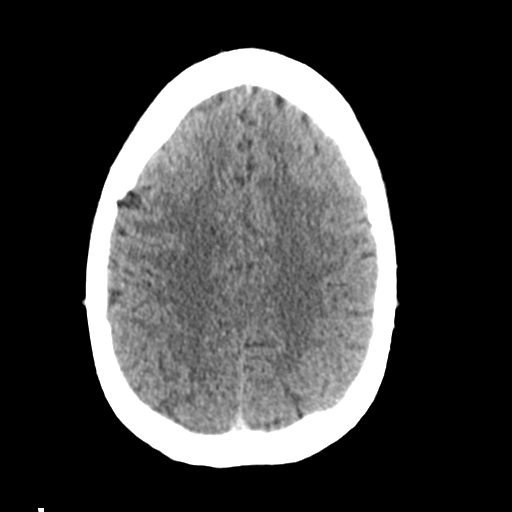
[im 26/32  brain]
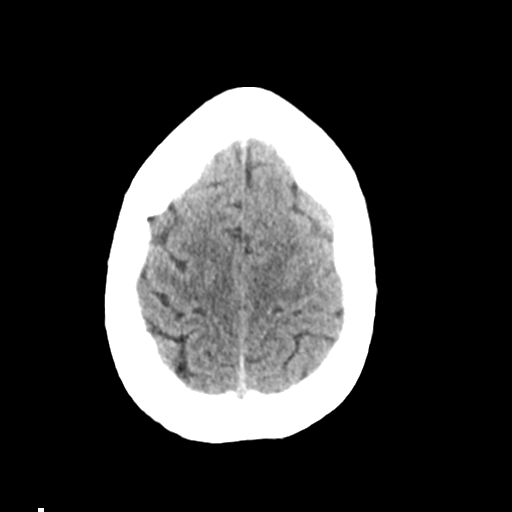
[im 29/32  brain]
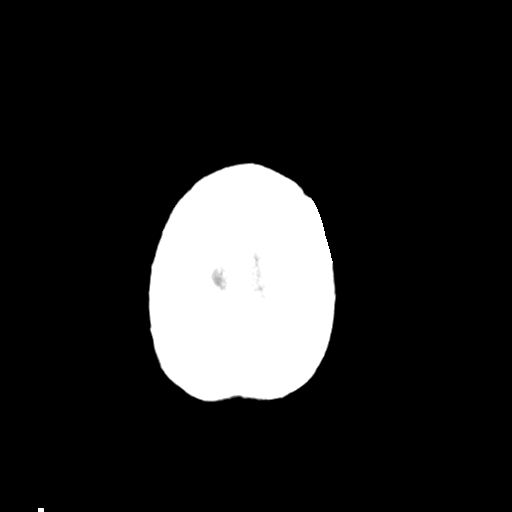
[im 29/32  bone]
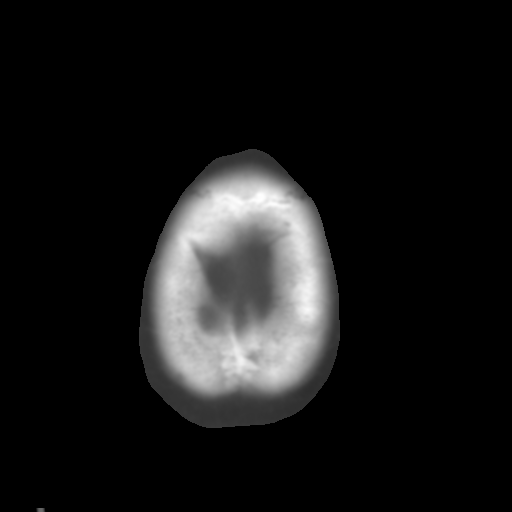

[Series 4: coronal soft tissue · coronal · 0.29mm/px · 3 of 67 slices shown]
[im 24/67  brain]
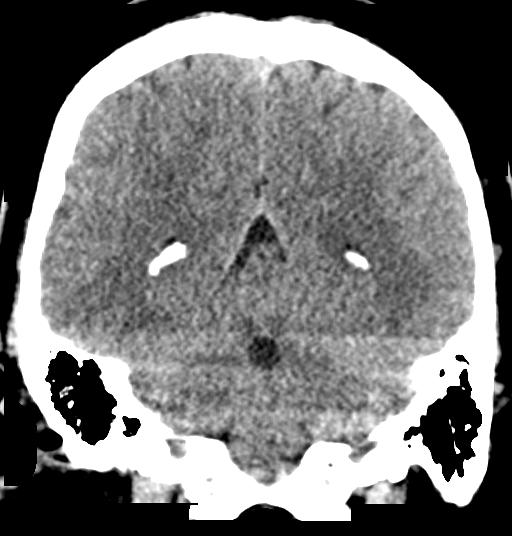
[im 30/67  brain]
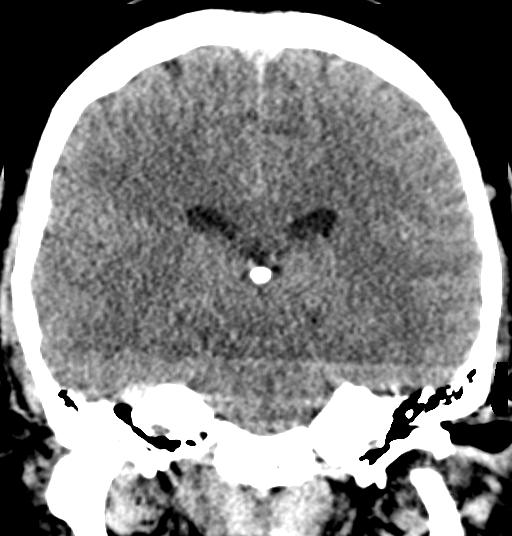
[im 37/67  brain]
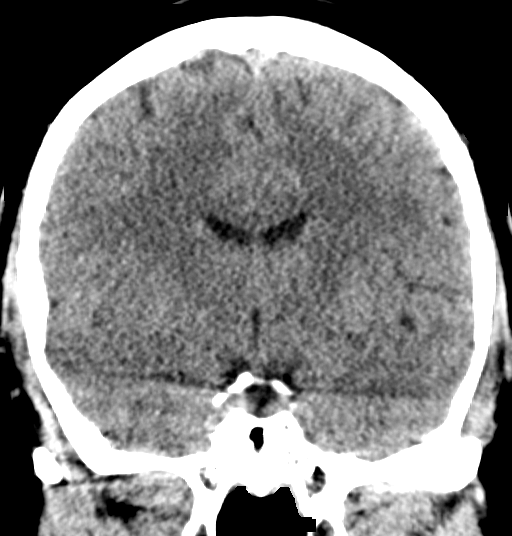

[Series 5: sagittal soft tissue · sagittal · 0.30mm/px · 3 of 50 slices shown]
[im 17/50  brain]
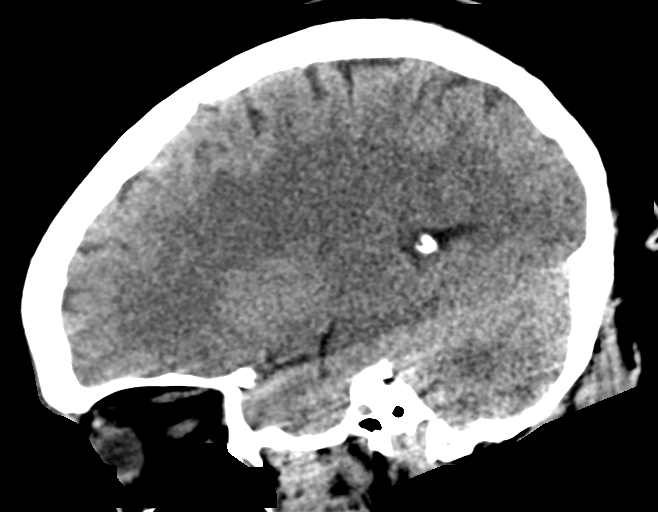
[im 25/50  brain]
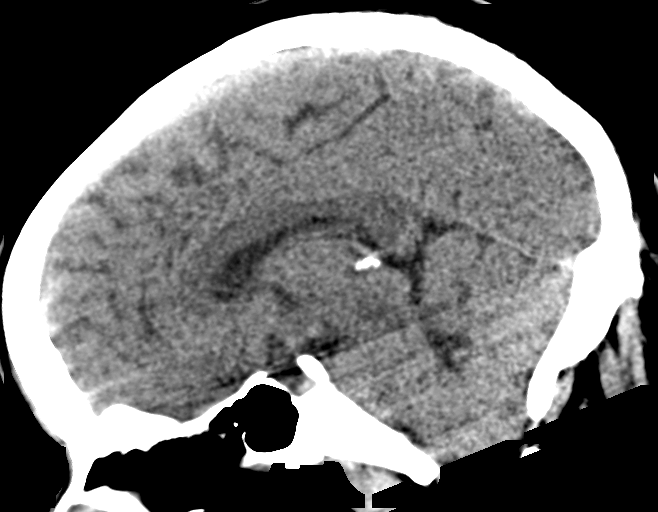
[im 33/50  brain]
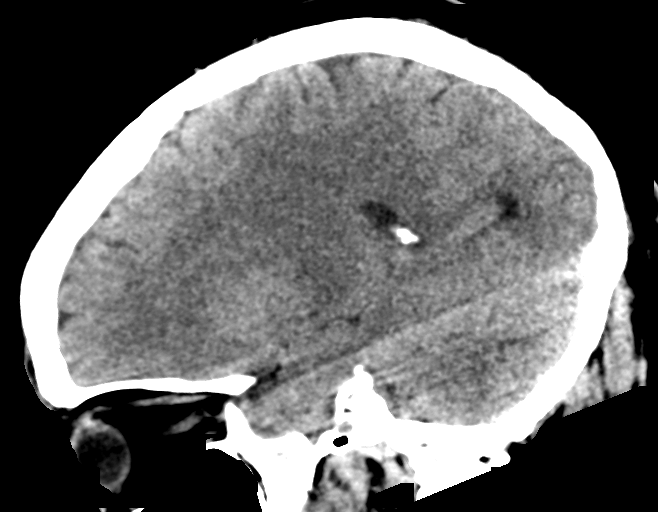

[15 of 47 positions shown; findings below may reference images not displayed]

FINDINGS: Brain: There is nearly symmetric abnormal hypodensity in the
bilateral globus pallidus (series 2, image 16). No associated
hemorrhage or mass effect.

Underlying cerebral volume is within normal limits. Outside of the
globus pallidus Gray-white matter differentiation is within normal
limits throughout the brain. No midline shift, ventriculomegaly,
mass effect, evidence of mass lesion, intracranial hemorrhage or
evidence of cortically based acute infarction. No cortical
encephalomalacia identified.

Vascular: Mild Calcified atherosclerosis at the skull base. No
suspicious intracranial vascular hyperdensity.

Skull: Negative.

Sinuses/Orbits: Minimal paranasal sinus mucosal thickening, and
posterior right ethmoid bubbly opacity. Tympanic cavities and
mastoids are clear. No fluid levels.

Other: Negative orbit and scalp soft tissues.
IMPRESSION: 1. Abnormal but age indeterminate hypodensity in the bilateral
globus pallidus. Such a pattern can be seen in the setting of toxic
exposure or ingestion (e.g. carbon monoxide), and anoxia. Brain MRI
without contrast may characterize further.
2. Elsewhere normal noncontrast CT appearance of the brain.
3. Mild paranasal sinus inflammation, significance doubtful.
